# Patient Record
Sex: Male | Born: 1965 | Race: Black or African American | Hispanic: No | Marital: Married | Smoking: Former smoker
Health system: Southern US, Community
[De-identification: ages and names within clinical notes are randomized; demographics above are authoritative.]

## PROBLEM LIST (undated history)

## (undated) DIAGNOSIS — I2510 Atherosclerotic heart disease of native coronary artery without angina pectoris: Secondary | ICD-10-CM

## (undated) DIAGNOSIS — G4733 Obstructive sleep apnea (adult) (pediatric): Secondary | ICD-10-CM

## (undated) DIAGNOSIS — Z9049 Acquired absence of other specified parts of digestive tract: Secondary | ICD-10-CM

## (undated) DIAGNOSIS — N529 Male erectile dysfunction, unspecified: Secondary | ICD-10-CM

## (undated) DIAGNOSIS — I219 Acute myocardial infarction, unspecified: Secondary | ICD-10-CM

## (undated) DIAGNOSIS — E785 Hyperlipidemia, unspecified: Secondary | ICD-10-CM

## (undated) DIAGNOSIS — K219 Gastro-esophageal reflux disease without esophagitis: Secondary | ICD-10-CM

## (undated) DIAGNOSIS — I10 Essential (primary) hypertension: Secondary | ICD-10-CM

## (undated) DIAGNOSIS — Z9289 Personal history of other medical treatment: Secondary | ICD-10-CM

## (undated) DIAGNOSIS — Z87891 Personal history of nicotine dependence: Secondary | ICD-10-CM

## (undated) HISTORY — DX: Essential (primary) hypertension: I10

## (undated) HISTORY — DX: Gastro-esophageal reflux disease without esophagitis: K21.9

## (undated) HISTORY — DX: Personal history of nicotine dependence: Z87.891

## (undated) HISTORY — PX: CORONARY STENT PLACEMENT: SHX1402

## (undated) HISTORY — DX: Obstructive sleep apnea (adult) (pediatric): G47.33

## (undated) HISTORY — DX: Acquired absence of other specified parts of digestive tract: Z90.49

## (undated) HISTORY — DX: Personal history of other medical treatment: Z92.89

## (undated) HISTORY — DX: Hyperlipidemia, unspecified: E78.5

## (undated) HISTORY — DX: Male erectile dysfunction, unspecified: N52.9

## (undated) HISTORY — PX: APPENDECTOMY: SHX54

## (undated) HISTORY — DX: Atherosclerotic heart disease of native coronary artery without angina pectoris: I25.10

---

## 2008-04-09 ENCOUNTER — Emergency Department (HOSPITAL_COMMUNITY): Admission: EM | Admit: 2008-04-09 | Discharge: 2008-04-09 | Payer: Self-pay | Admitting: Emergency Medicine

## 2009-06-29 DIAGNOSIS — I219 Acute myocardial infarction, unspecified: Secondary | ICD-10-CM

## 2009-06-29 HISTORY — DX: Acute myocardial infarction, unspecified: I21.9

## 2010-04-19 ENCOUNTER — Inpatient Hospital Stay (HOSPITAL_COMMUNITY): Admission: EM | Admit: 2010-04-19 | Discharge: 2010-04-21 | Payer: Self-pay | Admitting: Emergency Medicine

## 2010-04-19 ENCOUNTER — Ambulatory Visit: Payer: Self-pay | Admitting: Cardiovascular Disease

## 2010-04-28 ENCOUNTER — Encounter: Payer: Self-pay | Admitting: Cardiology

## 2010-04-29 ENCOUNTER — Telehealth (INDEPENDENT_AMBULATORY_CARE_PROVIDER_SITE_OTHER): Payer: Self-pay | Admitting: *Deleted

## 2010-05-01 ENCOUNTER — Telehealth (INDEPENDENT_AMBULATORY_CARE_PROVIDER_SITE_OTHER): Payer: Self-pay | Admitting: *Deleted

## 2010-05-08 ENCOUNTER — Encounter: Payer: Self-pay | Admitting: Cardiology

## 2010-05-08 ENCOUNTER — Encounter (HOSPITAL_COMMUNITY)
Admission: RE | Admit: 2010-05-08 | Discharge: 2010-06-28 | Payer: Self-pay | Source: Home / Self Care | Attending: Cardiology | Admitting: Cardiology

## 2010-05-09 ENCOUNTER — Ambulatory Visit: Payer: Self-pay | Admitting: Cardiology

## 2010-05-09 ENCOUNTER — Ambulatory Visit: Payer: Self-pay

## 2010-05-09 ENCOUNTER — Encounter: Payer: Self-pay | Admitting: Cardiology

## 2010-05-09 ENCOUNTER — Ambulatory Visit (HOSPITAL_COMMUNITY): Admission: RE | Admit: 2010-05-09 | Discharge: 2010-05-09 | Payer: Self-pay | Admitting: Cardiology

## 2010-05-09 DIAGNOSIS — I2541 Coronary artery aneurysm: Secondary | ICD-10-CM | POA: Insufficient documentation

## 2010-05-09 DIAGNOSIS — I2510 Atherosclerotic heart disease of native coronary artery without angina pectoris: Secondary | ICD-10-CM | POA: Insufficient documentation

## 2010-05-09 DIAGNOSIS — I10 Essential (primary) hypertension: Secondary | ICD-10-CM | POA: Insufficient documentation

## 2010-05-09 DIAGNOSIS — E785 Hyperlipidemia, unspecified: Secondary | ICD-10-CM | POA: Insufficient documentation

## 2010-05-12 ENCOUNTER — Telehealth (INDEPENDENT_AMBULATORY_CARE_PROVIDER_SITE_OTHER): Payer: Self-pay | Admitting: *Deleted

## 2010-05-14 ENCOUNTER — Encounter: Payer: Self-pay | Admitting: Cardiology

## 2010-05-15 ENCOUNTER — Telehealth: Payer: Self-pay | Admitting: Cardiology

## 2010-05-19 ENCOUNTER — Telehealth: Payer: Self-pay | Admitting: Cardiology

## 2010-05-20 ENCOUNTER — Encounter: Payer: Self-pay | Admitting: Cardiology

## 2010-05-29 ENCOUNTER — Telehealth: Payer: Self-pay | Admitting: Cardiology

## 2010-06-02 ENCOUNTER — Encounter: Payer: Self-pay | Admitting: Cardiology

## 2010-06-03 ENCOUNTER — Encounter: Payer: Self-pay | Admitting: Cardiology

## 2010-06-04 ENCOUNTER — Ambulatory Visit: Payer: Self-pay | Admitting: Cardiology

## 2010-06-06 ENCOUNTER — Encounter: Payer: Self-pay | Admitting: Cardiology

## 2010-06-06 ENCOUNTER — Telehealth: Payer: Self-pay | Admitting: Internal Medicine

## 2010-06-06 LAB — CONVERTED CEMR LAB
BUN: 10 mg/dL (ref 6–23)
CO2: 29 meq/L (ref 19–32)
Chloride: 103 meq/L (ref 96–112)
Creatinine, Ser: 1.1 mg/dL (ref 0.4–1.5)
Glucose, Bld: 102 mg/dL — ABNORMAL HIGH (ref 70–99)

## 2010-06-09 ENCOUNTER — Encounter: Payer: Self-pay | Admitting: Cardiology

## 2010-06-10 ENCOUNTER — Telehealth: Payer: Self-pay | Admitting: Cardiology

## 2010-06-11 ENCOUNTER — Encounter: Payer: Self-pay | Admitting: Cardiology

## 2010-06-13 ENCOUNTER — Telehealth (INDEPENDENT_AMBULATORY_CARE_PROVIDER_SITE_OTHER): Payer: Self-pay | Admitting: *Deleted

## 2010-06-13 ENCOUNTER — Telehealth: Payer: Self-pay | Admitting: Cardiology

## 2010-06-19 ENCOUNTER — Encounter: Payer: Self-pay | Admitting: Cardiology

## 2010-06-19 ENCOUNTER — Telehealth: Payer: Self-pay | Admitting: Cardiology

## 2010-06-27 ENCOUNTER — Ambulatory Visit: Payer: Self-pay | Admitting: Cardiology

## 2010-07-01 ENCOUNTER — Telehealth (INDEPENDENT_AMBULATORY_CARE_PROVIDER_SITE_OTHER): Payer: Self-pay | Admitting: *Deleted

## 2010-07-03 ENCOUNTER — Encounter: Payer: Self-pay | Admitting: Cardiology

## 2010-07-04 ENCOUNTER — Encounter: Payer: Self-pay | Admitting: Cardiology

## 2010-07-04 LAB — CONVERTED CEMR LAB
Albumin: 4.2 g/dL (ref 3.5–5.2)
BUN: 12 mg/dL (ref 6–23)
Calcium: 9.4 mg/dL (ref 8.4–10.5)
Cholesterol: 160 mg/dL (ref 0–200)
Creatinine, Ser: 1.1 mg/dL (ref 0.4–1.5)
GFR calc non Af Amer: 98.19 mL/min (ref >60.00)
Glucose, Bld: 79 mg/dL (ref 70–99)
HDL: 66.6 mg/dL (ref >39.00)
VLDL: 18.4 mg/dL (ref 0.0–40.0)

## 2010-07-29 NOTE — Miscellaneous (Signed)
  Clinical Lists Changes  Observations: Added new observation of ECHOINTERP:   - Left ventricle: Upper septal thickening, but no LVOT gradient. The       cavity size was normal. Wall thickness was increased increased in       a pattern of mild to moderate LVH. The estimated ejection fraction       was 65%. Doppler parameters are consistent with high ventricular       filling pressure.     - Left atrium: The atrium was mildly dilated.     - Right ventricle: The cavity size was mildly dilated. Systolic       function was mildly reduced. (05/09/2010 14:32)      Echocardiogram  Procedure date:  05/09/2010  Findings:        - Left ventricle: Upper septal thickening, but no LVOT gradient. The       cavity size was normal. Wall thickness was increased increased in       a pattern of mild to moderate LVH. The estimated ejection fraction       was 65%. Doppler parameters are consistent with high ventricular       filling pressure.     - Left atrium: The atrium was mildly dilated.     - Right ventricle: The cavity size was mildly dilated. Systolic       function was mildly reduced.

## 2010-07-29 NOTE — Assessment & Plan Note (Signed)
Summary: eph   Visit Type:  new pt, eph Primary Provider:  Dr. Anna Genre [facility])  CC:  chest pain.  History of Present Illness: 45 yo with history of HTN and hyperlipidemia presents to establish outpatient cardiology followup after NSTEMI with DES to PDA.  He was admitted in 10/11 with chest pain/NSTEMI, and LHC showed 99% PDA stenosis which was treated with Promus DES.  EF is preserved on echo done today.  Patient has been doing well in general since discharge.  He has altered his diet and is avoiding fried food.  He had on episode of chest tightness last Tuesday that seemed to be related to stress.  He took NTG without immediate resolution but the chest pain resolved soon on its own.  He has been walking for exercise without exertional dyspnea or chest pain.  He is going to start cardiac rehab on Monday and remain in the program as long as possible, though he plans to go back to work on 11/28 and is unsure if he will be able to complete rehab.    ECG: NSR at 56, normal  Labs (10/11): K 3.9, creatinine 1.24  Current Medications (verified): 1)  Aminofen 325 Mg Tabs (Acetaminophen) .[name].. 1-2 Tablets Every 4-6 Hours As Needed 2)  Aspirin Ec 325 Mg Tbec (Aspirin) .... Take One Tablet By Mouth Daily 3)  Lipitor 80 Mg Tabs (Atorvastatin Calcium) .... Take One Tablet By Mouth Daily. 4)  Lisinopril 10 Mg Tabs (Lisinopril) .... Take One Tablet By Mouth Daily 5)  Metoprolol Tartrate 25 Mg Tabs (Metoprolol Tartrate) .... Take One Tablet By Mouth Twice A Day 6)  Nitrostat 0.4 Mg Subl (Nitroglycerin) .[name].. 1 Tablet Under Tongue At Onset of Chest Pain; You May Repeat Every 5 Minutes For Up To 3 Doses. 7)  Amlodipine Besylate 10 Mg Tabs (Amlodipine Besylate) .... Take 1/2 Tablet By Mouth Daily 8)  Effient 10 Mg Tabs (Prasugrel Hcl) .... Once Daily 9)  Prilosec 20 Mg Cpdr (Omeprazole) .... Once Daily 10)  Visine 0.05 % Soln (Tetrahydrozoline Hcl) .... 2 Droppes in Each Eye, Once Daily  Allergies  (verified): No Known Drug Allergies  Past History:  Past Medical History: 1. Hypertension 2. Hyperlipidemia 3. GERD 4. CAD: NSTEMI 10/11.  LHC showed 99% PDA, 60% OM2, EF 65%.  He had 2.25 x 15 Promus DES to PDA.  Echo (11/11) showed EF 60%, mild to moderate LVH, moderate diastolic dysfunction.  5. Prior smoker 6. h/o appendectomy  Family History: No premature CAD.  Grandfathers both had CAD, diagnosed in their 36s and 2s.  DM.  Healthy siblings.   Social History: The patient smoked half-pack per day for 20 years, quit in 7/11.  He denies use of alcohol or drugs.   He lives with his girlfriend in [place].  Has children.  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      Review of Systems       All systems reviewed and negative except as per HPI.   Vital Signs:  Patient profile:   45 year old male Height:      72 inches Weight:      290.25 pounds BMI:     39.51 Pulse rate:   62 / minute BP sitting:   138 / 90  (left arm) Cuff size:   large  Vitals Entered By: Caralee Ates CMA (May 09, 2010 9:39 AM)  Physical Exam  General:  Well developed, well nourished, in no acute distress.  Obese.  Head:  normocephalic and atraumatic Nose:  no deformity, discharge, inflammation, or lesions Mouth:  Teeth, gums and palate normal. Oral mucosa normal. Neck:  Neck supple, no JVD. No masses, thyromegaly or abnormal cervical nodes. Lungs:  Clear bilaterally to auscultation and percussion. Heart:  Non-displaced PMI, chest non-tender; regular rate and rhythm, S1, S2 without murmurs, rubs or gallops. Carotid upstroke normal, no bruit. Pedals normal pulses. No edema, no varicosities. Abdomen:  Bowel sounds positive; abdomen soft and non-tender without masses, organomegaly, or hernias noted. No hepatosplenomegaly. Extremities:  No clubbing or cyanosis. Neurologic:  Alert and oriented x 3. Skin:  Intact without lesions or rashes. Psych:  Normal affect.   Impression &  Recommendations:  Problem # 1:  CAD, NATIVE VESSEL (ICD-414.01) NSTEMI 10/11.  Patient has done well since going home.  EF is preserved on echo today.  He is planning to start cardiac rehab Monday.  He will go back to work on 11/28 as long as he is doing well in rehab (does not know how long he will actually be able to continue rehab).  He will continue ASA and Effient as well as beta blocker, ACEI, and high dose statin.   Problem # 2:  HYPERTENSION, UNSPECIFIED (ICD-401.9) Borderline BP today.  I am going to change his beta blocker to carvedilol for better BP lowering effect.   Problem # 3:  HYPERLIPIDEMIA-MIXED (ICD-272.4) Needs lipids/LFTs in about a month on Lipitor.  Goal LDL < 70.   Patient Instructions: 1)  Your physician has recommended you make the following change in your medication:  2)  Stop Metoprolol. 3)  Start Coreg(carvedilol) 6.25mg  twice a day. 4)  Your physician recommends that you return for a FASTING lipid profile/liver profile/BMP in 2 months. 414.01 401.9 5)  Your physician recommends that you schedule a follow-up appointment in: 2 months with Dr Shirlee Latch. It is OK to have your lab done the same day of your appointment with Dr Shirlee Latch in 2 months. 6)  It is OK to participate in Cardiac Rehab on Monday November 11,2011. Prescriptions: COREG 6.25 MG TABS (CARVEDILOL) one twice a day  #60 x 6   Entered by:   Katina Dung, RN, BSN   Authorized by:   Marca Ancona, MD   Signed by:   Katina Dung, RN, BSN on 05/09/2010   Method used:   Electronically to        [facility] (retail)       [address]       [place], [place]  [address]       Ph: [phone]       Fax: [phone]   RxID:   [id number]

## 2010-07-29 NOTE — Progress Notes (Signed)
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  Katina Dung, RN, BSN  May 19, 2010 1:49 PM      Appended Document: calling returning to work Increase amlodipine to 10 mg daily.  Call for BP check in 2 wks.  If patient feels well, he can return to work on the 11/28.  If he feels like he needs more time, can arrange for that.   Appended Document: calling returning to work I talked with Carlette at Cardiac Rehab--pt 's B/P is trending down (coreg increased to 12.5mg  two times a day -05/15/10) -yesterday pre-exercise B/P was 126/60--per Dr Milda Smart not increase Amlodipine at present --Carlette will continue to take and record pt's B/P and will fax a B/P log to Dr Shirlee Latch in 2 weeks for him to review-pt is aware of above--pt feels he is not ready to return to work 05/26/10--pt will not  be able to attend Cardiac Rehab  when he returns to work--he feels like he  would benefit from at least  2 more weeks of Cardiac Rehab  to improve his exercise tolerance and physical conditioning--he feels he would also  benefit from extra time for B/P monitoring and control --pt requesting note to remain out of work until December 12,2011

## 2010-07-29 NOTE — Letter (Signed)
Summary: [facility]  [facility]   Imported By: Marylou Mccoy 05/21/2010 14:20:48  _____________________________________________________________________  External Attachment:    Type:   Image     Comment:   External Document

## 2010-07-29 NOTE — Progress Notes (Signed)
----   Converted from flag ---- ---- 05/01/2010 11:37 AM, Bary Leriche wrote: Blanca Friend, I am sending you this message, because Thurston Hole is out until Monday. I am sending Dr. Shirlee Latch 2 forms to review. One is a disability and on is an Transport planner. They are for Festus Pursel  dob 13-Feb-1966.    Thank You, Elease Hashimoto at [employer] ------------------------------

## 2010-07-29 NOTE — Letter (Signed)
Summary: Return To Work  [facility], Main Office  [address]   [place], [place] [address]   Phone: [phone]  Fax: [phone]    05/20/2010  TO: Leodis Sias IT MAY CONCERN   RE: [name] [address] [place]   The above named individual is under my medical care and should remain out of work until 06/09/10.   If you have any further questions or need additional information, please call.     Sincerely,      Denzil Mceachron,MD

## 2010-07-29 NOTE — Progress Notes (Signed)
Summary: B/P readings from Cardiac Rehab  Phone Note Outgoing Call   Call placed by: Katina Dung, RN, BSN,  May 29, 2010 2:40 PM Call placed to: Patient Summary of Call: B/P  Follow-up for Phone Call        received B/P readings from Cardiac Rehab--Dr Shirlee Latch reviewed--Dr Shirlee Latch recommended pt increase Lisinopril to 20mg  daily--I talked with pt and he verbalized understanding--pt states he is scheduled to return to work and is requesting an appt with Dr Shirlee Latch prior to returning to work--I have scheduled an appt with Dr Shirlee Latch and a BMP for 06/04/10-B/P readings from Cardiac Rehab to HIT to be scanned into EMR     New/Updated Medications: LISINOPRIL 20 MG TABS (LISINOPRIL) one daily Prescriptions: LISINOPRIL 20 MG TABS (LISINOPRIL) one daily  #30 x 6   Entered by:   Katina Dung, RN, BSN   Authorized by:   Marca Ancona, MD   Signed by:   Katina Dung, RN, BSN on 05/29/2010   Method used:   Electronically to        [facility] (retail)       [address]       [place], [place]  [address]       Ph: [phone]       Fax: [phone]   RxID:   [id number]

## 2010-07-29 NOTE — Progress Notes (Signed)
Summary: elevated B/P at Cardiac Rehab  Phone Note Outgoing Call   Call placed by: Katina Dung, RN, BSN,  May 15, 2010 10:42 AM Call placed to: Patient Summary of Call: B/P evelvated at Cardiac Rehab  Follow-up for Phone Call        Received fax from cardiac rehab with elevated B/P readings--Dr Shirlee Latch recommended increasing Coreg  to 12.5mg  twice a day--I talked with pt --he verbalized understanding--original report from Cardiac Rehab  to EMR     New/Updated Medications: COREG 12.5 MG TABS (CARVEDILOL) one twice a day Prescriptions: COREG 12.5 MG TABS (CARVEDILOL) one twice a day  #60 x 6   Entered by:   Katina Dung, RN, BSN   Authorized by:   Marca Ancona, MD   Signed by:   Katina Dung, RN, BSN on 05/15/2010   Method used:   Electronically to        [facility] (retail)       [address]       [place], [place]  [address]       Ph: [phone]       Fax: [phone]   RxID:   [id number]    Current Medications (verified): 1)  Aminofen 325 Mg Tabs (Acetaminophen) .[name].. 1-2 Tablets Every 4-6 Hours As Needed 2)  Aspirin Ec 325 Mg Tbec (Aspirin) .... Take One Tablet By Mouth Daily 3)  Lipitor 80 Mg Tabs (Atorvastatin Calcium) .... Take One Tablet By Mouth Daily. 4)  Lisinopril 10 Mg Tabs (Lisinopril) .... Take One Tablet By Mouth Daily 5)  Coreg 12.5 Mg Tabs (Carvedilol) .... One Twice A Day 6)  Nitrostat 0.4 Mg Subl (Nitroglycerin) .[name].. 1 Tablet Under Tongue At Onset of Chest Pain; You May Repeat Every 5 Minutes For Up To 3 Doses. 7)  Amlodipine Besylate 10 Mg Tabs (Amlodipine Besylate) .... Take 1/2 Tablet By Mouth Daily 8)  Effient 10 Mg Tabs (Prasugrel Hcl) .... Once Daily 9)  Prilosec 20 Mg Cpdr (Omeprazole) .... Once Daily 10)  Visine 0.05 % Soln (Tetrahydrozoline Hcl) .... 2 Droppes in Each Eye, Once Daily  Allergies: No Known Drug Allergies

## 2010-07-29 NOTE — Progress Notes (Signed)
  Pt dropped Of [employer] papers to be completed, also completed Disability packet have sent all to [employer] for completion. [facility]  April 29, 2010 11:44 AM

## 2010-07-29 NOTE — Progress Notes (Signed)
Summary: [name] cardiac rehab  Phone Note From Other Clinic   Caller: [name] [phone] Summary of Call: Allen calling back to explain situation re pt Initial call taken by: Glynda Jaeger,  June 06, 2010 1:21 PM  Follow-up for Phone Call        Spoke with Carlette from cardiac rehab and she advised me that his BP at rest was normal but with exercise it increased to 190/100 and then back down to normal. They will foward  BP readings to Dr.McLean and continue to monitor next week.  Will give patient a new note with potential RTW date of 06/16/2010 pending BP issue. He does heavy lifting at work. Will let Dr. Shirlee Latch know.  Layne Benton, RN, BSN  June 06, 2010 2:02 PM

## 2010-07-29 NOTE — Miscellaneous (Signed)
Summary: [facility] Physician Order/Treatment Plan   [facility] Physician Order/Treatment Plan   Imported By: Roderic Ovens 05/05/2010 09:09:31  _____________________________________________________________________  External Attachment:    Type:   Image     Comment:   External Document

## 2010-07-29 NOTE — Progress Notes (Signed)
Summary: Records Request  Faxed OV & EKG to Carlette at [facility]. ([phone]). Debby Freiberg  May 12, 2010 9:26 AM

## 2010-07-31 NOTE — Miscellaneous (Signed)
Summary: [name] Cardiac Progress Report   [name] Card[facility] Progress Report   Imported By: Roderic Ovens 07/10/2010 16:38:34  _____________________________________________________________________  External Attachment:    Type:   Image     Comment:   External Document

## 2010-07-31 NOTE — Letter (Signed)
Summary: [facility]  [facility]   Imported By: Marylou Mccoy 06/10/2010 15:21:26  _____________________________________________________________________  External Attachment:    Type:   Image     Comment:   External Document

## 2010-07-31 NOTE — Letter (Signed)
Summary: [facility]  [facility]   Imported By: Marylou Mccoy 06/27/2010 18:15:09  _____________________________________________________________________  External Attachment:    Type:   Image     Comment:   External Document

## 2010-07-31 NOTE — Miscellaneous (Signed)
Summary: [facility]  [facility]   Imported By: Erle Crocker 06/13/2010 11:57:43  _____________________________________________________________________  External Attachment:    Type:   Image     Comment:   External Document

## 2010-07-31 NOTE — Letter (Signed)
Summary: Custom - Lipid  [facility], Main Office  [address]   [place], [place] [address]   Phone: [phone]  Fax: [phone]     July 04, 2010 MRN: [id number]   [facility] [address] [place], [place]  [address]   Dear Mr. Weinmann,  Dr. Shirlee Latch has reviewed your cholesterol results.  They are as follows:     Total Cholesterol:    160 (Desirable: less than 200)       HDL  Cholesterol:     66.60  (Desirable: greater than 40 for men and 50 for women)       LDL Cholesterol:       75  (Desirable: less than 100 for low risk and less than 70 for moderate to high risk)       Triglycerides:       92.0  (Desirable: less than 150)  His  recommendations include: this is good! No new recommendations.   Call our office at the number listed above if you have any questions.  Lowering your LDL cholesterol is important, but it is only one of a large number of "risk factors" that may indicate that you are at risk for heart disease, stroke or other complications of hardening of the arteries.  Other risk factors include:   A.  Cigarette Smoking* B.  High Blood Pressure* C.  Obesity* D.   Low HDL Cholesterol (see yours above)* E.   Diabetes Mellitus (higher risk if your is uncontrolled) F.  Family history of premature heart disease G.  Previous history of stroke or cardiovascular disease    *These are risk factors YOU HAVE CONTROL OVER.  For more information, visit .  There is now evidence that lowering the TOTAL CHOLESTEROL AND LDL CHOLESTEROL can reduce the risk of heart disease.  The American Heart Association recommends the following guidelines for the treatment of elevated cholesterol:  1.  If there is now current heart disease and less than two risk factors, TOTAL CHOLESTEROL should be less than 200 and LDL CHOLESTEROL should be less than 100. 2.  If there is current heart disease or two or more risk factors, TOTAL CHOLESTEROL should be less than 200  and LDL CHOLESTEROL should be less than 70.  A diet low in cholesterol, saturated fat, and calories is the cornerstone of treatment for elevated cholesterol.  Cessation of smoking and exercise are also important in the management of elevated cholesterol and preventing vascular disease.  Studies have shown that 30 to 60 minutes of physical activity most days can help lower blood pressure, lower cholesterol, and keep your weight at a healthy level.  Drug therapy is used when cholesterol levels do not respond to therapeutic lifestyle changes (smoking cessation, diet, and exercise) and remains unacceptably high.  If medication is started, it is important to have you levels checked periodically to evaluate the need for further treatment options.  Thank you,    [facility] Team

## 2010-07-31 NOTE — Miscellaneous (Signed)
Summary: [facility] Cardiac Progress Report   [facility] Progress Report   Imported By: Roderic Ovens 06/24/2010 16:33:09  _____________________________________________________________________  External Attachment:    Type:   Image     Comment:   External Document

## 2010-07-31 NOTE — Progress Notes (Signed)
Summary: need a note to return to work  Phone Note Call from Patient Call back at [employer] [phone]   Caller: Spouse/Carol Complaint: Cough/Sore throat Summary of Call: Pt need a return note for the first year need to state for the up coming year  Initial call taken by: Judie Grieve,  June 10, 2010 2:22 PM  Follow-up for Phone Call        wants a note stating that he needs to be out of work until the 1st of the year.  He needs to continue in cardiac rehab but d/t his BP being elevated they have not been able to increase his work-load to get him where he needs to be.  The  company is going to close down for 2 weeks for the holiday and he needs the letter ASAP.  Pt aware approval for this letter will need tocome from Dr Shirlee Latch and he is not in the office today.  Pt states understanding Follow-up by: Charolotte Capuchin, RN,  June 10, 2010 3:32 PM     Appended Document: need a note to return to work Please get this note for him, thanks.

## 2010-07-31 NOTE — Progress Notes (Signed)
Summary: question on meds  Phone Note Call from Patient Call back at Home Phone [phone] Call back at [phone]   Caller: Daughter/carol Reason for Call: Talk to Nurse Summary of Call: pt has a reaction to his meds. pt daughter stated he been geting dizzy when he takes his meds. Initial call taken by: Roe Coombs,  June 19, 2010 8:05 AM  Follow-up for Phone Call        RN attempted to call back at both numbers; left message on [phone] Bernita Raisin, RN, BSN  June 19, 2010 8:27 AM  Jolyn Lent - Pt's (girlfriend): stated that Carvedilol was increased (took new dose on Monday 06/16/10) and on Tuesday 06/17/10 began experiencing a lot of dizziness; reminded him of his vertigo symptoms several years ago. Pt went to PCP and received a cortisone shot in his shoulder. Pt was also given Rx for Flexeril on Tuesday. BP 130/84, HR 72. Pt went to Cardiac Rehab on Wednesday 06/18/10; informed Shanda Bumps of experiencing dizziness/lightheadedness.  RN reviewed with DODJuanda Chance. Cardiac Rehab Report reviewed; Advised to Decrease Carvedilol to previous dose 12.5 Mg 1 tablet two times a day. Increase Lisinopril to 40 Mg 1 tablet once a day. Repeat BMP in 7-10 days. BMP Appt scheduled for June 27, 2010 per Pt request; will also have fasting labs as well. RN s/w Pt's girlfriend - Okey Regal verbalizes understanding.    Follow-up by: Bernita Raisin, RN, BSN,  June 19, 2010 10:00 AM    New/Updated Medications: LISINOPRIL 40 MG TABS (LISINOPRIL) take 1 tablet by mouth daily COREG 12.5 MG TABS (CARVEDILOL) one tablet  twice a day  Current Allergies (reviewed today): No known allergies

## 2010-07-31 NOTE — Letter (Signed)
Summary: [facility]  [facility]   Imported By: Marylou Mccoy 06/16/2010 18:28:18  _____________________________________________________________________  External Attachment:    Type:   Image     Comment:   External Document

## 2010-07-31 NOTE — Progress Notes (Signed)
  Phone Note Call from Patient   Caller: Patient Summary of Call: Clearance/Restrictions Initial call taken by: KM  Follow-up for Phone Call        pt' job calling to say they need exact date pt can return to work, as well as any restrictions he may have Glynda Jaeger  July 02, 2010 10:57 AM   Additional Follow-up for Phone Call Additional follow up Details #1::        Faxed [employer] paper to [employer] @ [phone]..it has restrictions/Clearance to be able to return to work Additional Follow-up by: .sign    Pt called from Job, his Job needs Clearance/restrictions Faxed to AttnRanda Evens @ [phone]. For him to be able to Continue to Work. [facility]  July 01, 2010 9:22 AM

## 2010-07-31 NOTE — Progress Notes (Signed)
  Request received from [facility] sent to [facility]  June 13, 2010 1:12 PM

## 2010-07-31 NOTE — Letter (Signed)
Summary: Generic Letter  [facility], Main Office  [address]   [place], [place] [address]   Phone: [phone]  Fax: [phone]        July 03, 2010 MRN: [id number]    [facility] [address] [place], [place]  [address]  To Whom It May Concern:  Mr Wichmann may return to work without restrictions.         Sincerely,     Artina Minella,MD  This letter has been electronically signed by your physician.  Appended Document: Generic Letter Back to Work Note Faxed to [employer] @ [phone]

## 2010-07-31 NOTE — Progress Notes (Signed)
Summary: increase Coreg  Phone Note Outgoing Call   Call placed by: Katina Dung, RN, BSN,  June 13, 2010 2:57 PM Call placed to: Patient Summary of Call: B/P readings at rehab  Follow-up for Phone Call        Dr Shirlee Latch recommended pt increase Coreg to  18.75mg  twice a day--I notified pt     New/Updated Medications: COREG 12.5 MG TABS (CARVEDILOL) one and one-half  twice a day Prescriptions: COREG 12.5 MG TABS (CARVEDILOL) one and one-half  twice a day  #90 x 6   Entered by:   Katina Dung, RN, BSN   Authorized by:   Marca Ancona, MD   Signed by:   Katina Dung, RN, BSN on 06/13/2010   Method used:   Electronically to        [facility] (retail)       [address]       [place], [place]  [address]       Ph: [phone]       Fax: [phone]   RxID:   [id number]    Current Medications (verified): 1)  Aspirin Ec 325 Mg Tbec (Aspirin) .... Take One Tablet By Mouth Daily 2)  Lipitor 80 Mg Tabs (Atorvastatin Calcium) .... Take One Tablet By Mouth Daily. 3)  Lisinopril 20 Mg Tabs (Lisinopril) .... One Daily 4)  Coreg 12.5 Mg Tabs (Carvedilol) .... One and One-Half  Twice A Day 5)  Nitrostat 0.4 Mg Subl (Nitroglycerin) .[name].. 1 Tablet Under Tongue At Onset of Chest Pain; You May Repeat Every 5 Minutes For Up To 3 Doses. 6)  Amlodipine Besylate 10 Mg Tabs (Amlodipine Besylate) .... Take One Tablet Daily 7)  Effient 10 Mg Tabs (Prasugrel Hcl) .... Once Daily 8)  Prilosec 20 Mg Cpdr (Omeprazole) .... Once Daily  Allergies: No Known Drug Allergies

## 2010-07-31 NOTE — Assessment & Plan Note (Signed)
Summary: [name]   Primary Provider:  Dr. Anna Genre [facility])  CC:  high blood pressure.  History of Present Illness: 45 yo with history of HTN and hyperlipidemia presents for followup after NSTEMI with DES to PDA.  He was admitted in 10/11 with chest pain/NSTEMI, and LHC showed 99% PDA stenosis which was treated with Promus DES.  EF was preserved on echo in 11/11.  Patient has been in cardiac rehab but has been limited somewhat by elevated BP, especially with exertion.  He has not had any chest pain.  No exertional dyspnea.  BP today is 142/98.  BP at cardiac rehab is showing an improving trend with med titration but is still too high.   Labs (10/11): K 3.9, creatinine 1.24  Current Medications (verified): 1)  Aspirin Ec 325 Mg Tbec (Aspirin) .... Take One Tablet By Mouth Daily 2)  Lipitor 80 Mg Tabs (Atorvastatin Calcium) .... Take One Tablet By Mouth Daily. 3)  Lisinopril 20 Mg Tabs (Lisinopril) .... One Daily 4)  Coreg 12.5 Mg Tabs (Carvedilol) .... One Twice A Day 5)  Nitrostat 0.4 Mg Subl (Nitroglycerin) .[name].. 1 Tablet Under Tongue At Onset of Chest Pain; You May Repeat Every 5 Minutes For Up To 3 Doses. 6)  Amlodipine Besylate 10 Mg Tabs (Amlodipine Besylate) .... Take 1/2 Tablet By Mouth Daily 7)  Effient 10 Mg Tabs (Prasugrel Hcl) .... Once Daily 8)  Prilosec 20 Mg Cpdr (Omeprazole) .... Once Daily  Allergies: No Known Drug Allergies  Past History:  Past Medical History: Reviewed history from 05/09/2010 and no changes required. 1. Hypertension 2. Hyperlipidemia 3. GERD 4. CAD: NSTEMI 10/11.  LHC showed 99% PDA, 60% OM2, EF 65%.  He had 2.25 x 15 Promus DES to PDA.  Echo (11/11) showed EF 60%, mild to moderate LVH, moderate diastolic dysfunction.  5. Prior smoker 6. h/o appendectomy  Family History: Reviewed history from 05/09/2010 and no changes required. No premature CAD.  Grandfathers both had CAD, diagnosed in their 14s and 71s.  DM.  Healthy siblings.   Social  History: Reviewed history from 05/09/2010 and no changes required. The patient smoked half-pack per day for 20 years, quit in 7/11.  He denies use of alcohol or drugs.   He lives with his girlfriend in [place].  Has children.  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      Review of Systems       All systems reviewed and negative except as per HPI.   Vital Signs:  Patient profile:   45 year old male Height:      72 inches Weight:      290 pounds BMI:     39.47 Pulse rate:   62 / minute Resp:     14 per minute BP sitting:   142 / 98  (left arm)  Vitals Entered By: Kem Parkinson (June 04, 2010 9:06 AM)  Physical Exam  General:  Well developed, well nourished, in no acute distress.  Obese.  Neck:  Neck supple, no JVD. No masses, thyromegaly or abnormal cervical nodes. Lungs:  Clear bilaterally to auscultation and percussion. Heart:  Non-displaced PMI, chest non-tender; regular rate and rhythm, S1, S2 without murmurs, rubs or gallops. Carotid upstroke normal, no bruit. Pedals normal pulses. No edema, no varicosities. Abdomen:  Bowel sounds positive; abdomen soft and non-tender without masses, organomegaly, or hernias noted. No hepatosplenomegaly. Extremities:  No clubbing or cyanosis. Neurologic:  Alert and oriented x 3. Psych:  Normal affect.  Impression & Recommendations:  Problem # 1:  CAD, NATIVE VESSEL (ICD-414.01) NSTEMI 10/11.  Patient has done well since going home.  EF was preserved on echo.  No chest pain.  He will continue ASA and Effient as well as beta blocker, ACEI, and high dose statin.   Problem # 2:  HYPERTENSION, UNSPECIFIED (ICD-401.9) BP is still running high and limiting advancement in cardiac rehab.  Increase amlodipine to 10 mg daily.  BP check in 1 week.   Problem # 3:  HYPERLIPIDEMIA-MIXED (ICD-272.4) Goal LDL < 70.  Needs lipids/LFTs in 1/12.    Will get BMET today after increasing lisinopril  Followup in 2 months.   Patient  Instructions: 1)  Your physician has recommended you make the following change in your medication:  2)  Increase Amlodipine to 10mg  daily. 3)  Take and record your blood pressure about 2 hours after you take your medication--I will call you in 2 weeks to get the readings. Luana Shu 4)  Lab today --BMP  414.01 401.9 5)  Your physician recommends that you return for a FASTING lipid profile/liver profile in January 2012  414.01 401.9 6)  Your physician recommends that you schedule a follow-up appointment in: 2 months with Dr Shirlee Latch. Prescriptions: AMLODIPINE BESYLATE 10 MG TABS (AMLODIPINE BESYLATE) Take one tablet daily  #30 x 6   Entered by:   Katina Dung, RN, BSN   Authorized by:   Marca Ancona, MD   Signed by:   Katina Dung, RN, BSN on 06/04/2010   Method used:   Electronically to        [facility] (retail)       [address]       [place], [place]  [address]       Ph: [phone]       Fax: [phone]   RxID:   [id number]

## 2010-07-31 NOTE — Letter (Signed)
Summary: Return To Work  [employer], Main Office  [address]   [place], [place] [address]   Phone: [phone]  Fax: [phone]    06/11/2010  TO: Leodis Sias IT MAY CONCERN   RE: [name] [address] [id number]   The above named individual is under my medical care and should remain out of work until January 2,2012.    If you have any further questions or need additional information, please call.     Sincerely,      Dalton McLean,MD

## 2010-07-31 NOTE — Letter (Signed)
Summary: Return To Work  [facility] [employer], Main Office  [address]   [place], [place] [address]   Phone: [phone]  Fax: [phone]    06/06/2010  TO: Leodis Sias IT MAY CONCERN   RE: NICKI GRACY [address] [id number]   The above named individual is under my medical care and may potentially return to work on:06/16/2010 pending medical issues.   If you have any further questions or need additional information, please call.     Sincerely,    Layne Benton, RN, BSN

## 2010-08-13 ENCOUNTER — Ambulatory Visit: Payer: BC Managed Care – PPO | Admitting: Cardiology

## 2010-08-13 ENCOUNTER — Encounter: Payer: Self-pay | Admitting: Cardiology

## 2010-08-20 NOTE — Assessment & Plan Note (Signed)
Summary: 2 MONTH ROV.SL/AMD   Allergies: No Known Drug Allergies

## 2010-09-09 NOTE — Letter (Signed)
Summary: [facility] Progress Note  [facility] Progress Note   Imported By: Earl Many 09/05/2010 13:00:49  _____________________________________________________________________  External Attachment:    Type:   Image     Comment:   External Document

## 2010-09-09 NOTE — Letter (Signed)
Summary: [facility]  [facility]   Imported By: Earl Many 09/05/2010 13:01:52  _____________________________________________________________________  External Attachment:    Type:   Image     Comment:   External Document

## 2010-09-10 LAB — DIFFERENTIAL
Basophils Absolute: 0 10*3/uL (ref 0.0–0.1)
Eosinophils Absolute: 0.2 10*3/uL (ref 0.0–0.7)
Eosinophils Relative: 3 % (ref 0–5)
Lymphs Abs: 2.1 10*3/uL (ref 0.7–4.0)
Monocytes Absolute: 0.7 10*3/uL (ref 0.1–1.0)

## 2010-09-10 LAB — BASIC METABOLIC PANEL
CO2: 26 mEq/L (ref 19–32)
CO2: 28 mEq/L (ref 19–32)
Calcium: 9 mg/dL (ref 8.4–10.5)
Creatinine, Ser: 1.11 mg/dL (ref 0.4–1.5)
GFR calc non Af Amer: >60 mL/min (ref >60)
Glucose, Bld: 102 mg/dL — ABNORMAL HIGH (ref 70–99)
Glucose, Bld: 105 mg/dL — ABNORMAL HIGH (ref 70–99)
Potassium: 3.9 mEq/L (ref 3.5–5.1)
Sodium: 140 mEq/L (ref 135–145)

## 2010-09-10 LAB — POCT I-STAT, CHEM 8
Calcium, Ion: 1.12 mmol/L (ref 1.12–1.32)
Creatinine, Ser: 1.3 mg/dL (ref 0.4–1.5)
Hemoglobin: 15.6 g/dL (ref 13.0–17.0)
Sodium: 141 mEq/L (ref 135–145)
TCO2: 31 mmol/L (ref 0–100)

## 2010-09-10 LAB — POCT CARDIAC MARKERS
Myoglobin, poc: 58 ng/mL (ref 12–200)
Troponin i, poc: <0.05 ng/mL (ref 0.00–0.09)

## 2010-09-10 LAB — MRSA PCR SCREENING: MRSA by PCR: POSITIVE — AB

## 2010-09-10 LAB — LIPID PANEL
Cholesterol: 233 mg/dL — ABNORMAL HIGH (ref 0–200)
Total CHOL/HDL Ratio: 4.3 RATIO

## 2010-09-10 LAB — CBC
HCT: 43 % (ref 39.0–52.0)
HCT: 43.8 % (ref 39.0–52.0)
Hemoglobin: 14.8 g/dL (ref 13.0–17.0)
MCH: 28.7 pg (ref 26.0–34.0)
MCH: 29.5 pg (ref 26.0–34.0)
MCHC: 33.8 g/dL (ref 30.0–36.0)
MCHC: 34 g/dL (ref 30.0–36.0)
MCV: 84.5 fL (ref 78.0–100.0)
Platelets: 254 10*3/uL (ref 150–400)
Platelets: 260 10*3/uL (ref 150–400)
RDW: 13.4 % (ref 11.5–15.5)

## 2010-09-10 LAB — CK TOTAL AND CKMB (NOT AT ARMC)
CK, MB: 2.4 ng/mL (ref 0.3–4.0)
Total CK: 273 U/L — ABNORMAL HIGH (ref 7–232)

## 2010-09-10 LAB — TROPONIN I: Troponin I: 0.65 ng/mL (ref 0.00–0.06)

## 2010-09-12 ENCOUNTER — Ambulatory Visit (INDEPENDENT_AMBULATORY_CARE_PROVIDER_SITE_OTHER): Payer: BC Managed Care – PPO | Admitting: Cardiology

## 2010-09-12 ENCOUNTER — Encounter: Payer: Self-pay | Admitting: Cardiology

## 2010-09-12 DIAGNOSIS — I2510 Atherosclerotic heart disease of native coronary artery without angina pectoris: Secondary | ICD-10-CM

## 2010-09-25 NOTE — Assessment & Plan Note (Signed)
Summary: 2 MONTH ROV.SL/AMD   Visit Type:  Follow-up Primary Provider:  Dr. Anna Genre [facility])  CC:  2 month f/u.  History of Present Illness: 45 yo with history of HTN and hyperlipidemia presents for followup after NSTEMI with DES to PDA.  He was admitted in 10/11 with chest pain/NSTEMI, and LHC showed 99% PDA stenosis which was treated with Promus DES.  EF was preserved on echo in 11/11.  No further chest pain.  He has completed cardiac rehab.  BP is now under reasonable control (120s/80s when he checks at home).  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  No significant exertional dyspnea.    Labs (10/11): K 3.9, creatinine 1.24 Labs (12/11): K 4, creatinine 1.1, LDL 75, HDL 67  Current Medications (verified): 1)  Aspirin Ec 325 Mg Tbec (Aspirin) .... Take One Tablet By Mouth Daily 2)  Lipitor 80 Mg Tabs (Atorvastatin Calcium) .... Take One Tablet By Mouth Daily. 3)  Lisinopril 10 Mg Tabs (Lisinopril) .[name].. 1 Once Daily 4)  Coreg 12.5 Mg Tabs (Carvedilol) .... One Tablet  Twice A Day 5)  Nitrostat 0.4 Mg Subl (Nitroglycerin) .[name].. 1 Tablet Under Tongue At Onset of Chest Pain; You May Repeat Every 5 Minutes For Up To 3 Doses. 6)  Amlodipine Besylate 10 Mg Tabs (Amlodipine Besylate) .... Take One Tablet Daily 7)  Effient 10 Mg Tabs (Prasugrel Hcl) .... Once Daily 8)  Prilosec 20 Mg Cpdr (Omeprazole) .... Once Daily 9)  Multivitamins  Tabs (Multiple Vitamin) .[name].. 1 Once Daily  Allergies: No Known Drug Allergies  Past History:  Past Medical History: Reviewed history from 05/09/2010 and no changes required. 1. Hypertension 2. Hyperlipidemia 3. GERD 4. CAD: NSTEMI 10/11.  LHC showed 99% PDA, 60% OM2, EF 65%.  He had 2.25 x 15 Promus DES to PDA.  Echo (11/11) showed EF 60%, mild to moderate LVH, moderate diastolic dysfunction.  5. Prior smoker 6. h/o appendectomy  Family History: Reviewed history from 05/09/2010 and no changes required. No premature CAD.  Grandfathers both had CAD,  diagnosed in their 73s and 40s.  DM.  Healthy siblings.   Social History: Reviewed history from 05/09/2010 and no changes required. The patient smoked half-pack per day for 20 years, quit in 7/11.  He denies use of alcohol or drugs.   He lives with his girlfriend in [place].  Has children.  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      Vital Signs:  Patient profile:   45 year old male Height:      72 inches Weight:      179 pounds BMI:     24.36 Pulse rate:   80 / minute BP sitting:   120 / 98  (left arm) Cuff size:   large  Vitals Entered By: Scherrie Bateman, LPN (September 12, 2010 8:40 AM)  Physical Exam  General:  Well developed, well nourished, in no acute distress.  Obese.  Neck:  Neck supple, no JVD. No masses, thyromegaly or abnormal cervical nodes. Lungs:  Clear bilaterally to auscultation and percussion. Heart:  Non-displaced PMI, chest non-tender; regular rate and rhythm, S1, S2 without murmurs, rubs or gallops. Carotid upstroke normal, no bruit. Pedals normal pulses. No edema, no varicosities. Abdomen:  Bowel sounds positive; abdomen soft and non-tender without masses, organomegaly, or hernias noted. No hepatosplenomegaly. Extremities:  No clubbing or cyanosis. Neurologic:  Alert and oriented x 3. Psych:  Normal affect.   Impression & Recommendations:  Problem # 1:  CAD, NATIVE VESSEL (ICD-414.01) NSTEMI 10/11.  Patient has done well since going home.  EF was preserved on echo.  No chest pain.  He will continue ASA and Effient as well as beta blocker, ACEI, and high dose statin.   Problem # 2:  HYPERLIPIDEMIA-MIXED (ICD-272.4) LDL close to goal (LDL < 70) when last checked.   Problem # 3:  HYPERTENSION, UNSPECIFIED (ICD-401.9) BP is under much better control.   Patient Instructions: 1)  Your physician recommends that you continue on your current medications as directed. Please refer to the Current Medication list given to you today. 2)  Your physician  wants you to follow-up in: 4 MONTHS WITH DR [facility]  You will receive a reminder letter in the mail two months in advance. If you don't receive a letter, please call our office to schedule the follow-up appointment.

## 2010-11-11 NOTE — Op Note (Signed)
[name], [name] NO.:  [id number]   MEDICAL RECORD NO.:  [id number]          PATIENT TYPE:  EMS   LOCATION:  [facility]                         FACILITY:  [facility]   PHYSICIAN:  Allen Done, MD  DATE OF BIRTH:  10/23/1965   DATE OF PROCEDURE:  04/09/2008  DATE OF DISCHARGE:  04/09/2008                               OPERATIVE REPORT   PREOPERATIVE DIAGNOSES:  1. Left long finger open distal phalanx fracture following crush and      saw injury.  2. Left ring finger open distal phalanx fracture from crush injury.   ATTENDING SURGEON:  Allen Done, MD, who scrubbed and present  during the entire procedure.   ASSISTANT SURGEON:  None.   SURGICAL PROCEDURES:  1. Debridement of skin and subcutaneous tissue and bone, left long      finger associated with open distal phalanx fracture.  2. Left long finger revision and amputation with local neurectomies      and advancement flap closure.  3. Left ring finger debridement of skin, subcutaneous tissue, and bone      associated with open fracture.  4. Closed treatment of left distal phalanx fracture, left ring finger.  5. Left ring finger subungual hematoma evacuation and nail bed repair.   ANESTHESIA:  General via LMA.   TOURNIQUET TIME:  Less than 30 minutes at 250 mmHg.   INTRAOPERATIVE FINDINGS:  The patient did have a comminuted open left  long finger fracture and soft tissue injury that was not amendable to  replantation or reconstruction.   SURGICAL INDICATIONS:  [name] is a 45 year old right-hand-dominant  gentleman who sustained a work-related injury to his left ring and long  fingers.  The patient presented to the emergency department with the  above injuries.  The patient was counseled in the emergency department  and we elected to proceed with the above procedures.  Risks, benefits,  and alternatives were discussed in detail with the patient and a signed  informed consent was obtained.  Risks  include but not limited to  bleeding, infection, skin necrosis, loss of motion of the digits,  persistent pain in the fingers, need for further surgical intervention,  infection, and damage to nearby nerves, arteries, and tendons.   DESCRIPTION OF PROCEDURE:  The patient was properly identified in the  preop holding area and marked with a permanent marker made on the made  on left long and ring fingers to indicate correct operative sites.  The  patient was then brought back to the operating room and placed supine on  the anesthesia room table where general anesthesia was administered via  LMA.  The patient received preoperative antibiotics prior to any skin  incisions, clindamycin secondary PENICILLIN allergy.  After induction of  anesthesia, a well-padded tourniquet was then placed on the left  brachium and sealed with a 1000 drape.  The left upper extremity was  prepped with Hibiclens and then sterilely draped.  A time-out was  called, the correct site was identified, and the procedure was then  begun.  Attention was then  turned to the left long finger where  debridement of the open fracture was then carried out of the skin,  subcutaneous tissue, and bone.  The patient did have several comminuted  fragments of distal phalanx that were removed in addition to some  devitalized skin and subcutaneous tissue.  Following the debridement, it  was then felt to shorten the bone of the distal phalanx, but protect the  insertion of the flexor digitorum profundus.  Bone debridement was then  carried back to allow for adequate wound closure.  Local neurectomies  were then [name] on both the digital nerves.  Following neurectomies, skin  flaps were then advanced volarly and dorsally to allow for adequate  closure.  There was good closure over the defect and good skin coverage  over the bone.  Following this, the skin flaps were then closed with 5-0  nylon simple sutures.  Attention was then turned to  the ring finger  where the nail plate was then removed and the open fracture was then  debrided in the skin, subcutaneous tissue, and bone through the defect  in the nailbed.  Following debridement of the open fracture, the  fracture site was stable and continue with the closed treatment and  internal fixation was not used to stabilize the distal phalanx.  The  nailbed was then repaired with 5-0 chromic sutures.  Several simple 5-0  chromic sutures were used with good reapproximation of the nailbed.  The  nail plate that was removed was then cleaned up and then placed beneath  the eponychium to protect the dressing.  The patient had an open wound  volarly to the ring finger which measured less than 1 cm and the wound  was closed with simple nylon 4-0 sutures.  Adaptic dressing and sterile  compressive dressings were then applied to both wounds.  Marcaine 0.25%  was then infiltrated, 10 mL and separated into both digits for local  anesthesia.  Sterile compressive dressings were then applied.  The  patient was then placed in the finger splint and extubated and taken to  the recovery room in good condition.   POSTOPERATIVE PLAN:  The patient will be discharged home.  He will be  seen back in the office in 4 days for wound check and then be referred  down to therapy for tip protector splint.  He will then begin gradual  motion and activity as his pain levels are diminished.      Allen Done, MD  Electronically Signed     FWO/MEDQ  D:  04/09/2008  T:  04/10/2008  Job:  [id number]

## 2010-11-22 ENCOUNTER — Other Ambulatory Visit: Payer: Self-pay | Admitting: Cardiovascular Disease

## 2010-12-18 ENCOUNTER — Other Ambulatory Visit: Payer: Self-pay | Admitting: Cardiovascular Disease

## 2011-01-02 ENCOUNTER — Other Ambulatory Visit: Payer: Self-pay | Admitting: *Deleted

## 2011-01-02 MED ORDER — ATORVASTATIN CALCIUM 80 MG PO TABS: 80.0000 mg | ORAL_TABLET | Freq: Every day | ORAL | Status: DC

## 2011-01-18 ENCOUNTER — Other Ambulatory Visit: Payer: Self-pay | Admitting: Cardiology

## 2011-02-27 ENCOUNTER — Other Ambulatory Visit: Payer: Self-pay | Admitting: Cardiology

## 2011-03-31 LAB — APTT: aPTT: 27

## 2011-03-31 LAB — BASIC METABOLIC PANEL
CO2: 28
Calcium: 9
Glucose, Bld: 96
Sodium: 141

## 2011-03-31 LAB — PROTIME-INR: INR: 1

## 2011-03-31 LAB — DIFFERENTIAL
Basophils Relative: 0
Lymphs Abs: 2.3
Monocytes Relative: 7
Neutro Abs: 6.1
Neutrophils Relative %: 66

## 2011-03-31 LAB — CBC
HCT: 45
Hemoglobin: 15
MCHC: 33.3
MCV: 85.8
RDW: 13.5

## 2011-04-02 ENCOUNTER — Encounter: Payer: Self-pay | Admitting: Physician Assistant

## 2011-04-03 ENCOUNTER — Ambulatory Visit (INDEPENDENT_AMBULATORY_CARE_PROVIDER_SITE_OTHER): Payer: BC Managed Care – PPO | Admitting: Physician Assistant

## 2011-04-03 ENCOUNTER — Encounter: Payer: Self-pay | Admitting: Physician Assistant

## 2011-04-03 VITALS — BP 148/86 | HR 56 | Ht 72.0 in | Wt 268.0 lb

## 2011-04-03 DIAGNOSIS — G4730 Sleep apnea, unspecified: Secondary | ICD-10-CM

## 2011-04-03 DIAGNOSIS — I2510 Atherosclerotic heart disease of native coronary artery without angina pectoris: Secondary | ICD-10-CM

## 2011-04-03 DIAGNOSIS — G4733 Obstructive sleep apnea (adult) (pediatric): Secondary | ICD-10-CM | POA: Insufficient documentation

## 2011-04-03 DIAGNOSIS — R079 Chest pain, unspecified: Secondary | ICD-10-CM

## 2011-04-03 DIAGNOSIS — R0602 Shortness of breath: Secondary | ICD-10-CM | POA: Insufficient documentation

## 2011-04-03 DIAGNOSIS — E669 Obesity, unspecified: Secondary | ICD-10-CM | POA: Insufficient documentation

## 2011-04-03 DIAGNOSIS — R609 Edema, unspecified: Secondary | ICD-10-CM

## 2011-04-03 LAB — HEPATIC FUNCTION PANEL
ALT: 26 U/L (ref 0–53)
AST: 22 U/L (ref 0–37)
Albumin: 4.6 g/dL (ref 3.5–5.2)
Alkaline Phosphatase: 62 U/L (ref 39–117)
Total Protein: 8.4 g/dL — ABNORMAL HIGH (ref 6.0–8.3)

## 2011-04-03 LAB — CBC WITH DIFFERENTIAL/PLATELET
Basophils Absolute: 0 10*3/uL (ref 0.0–0.1)
Hemoglobin: 15.4 g/dL (ref 13.0–17.0)
Lymphocytes Relative: 38.5 % (ref 12.0–46.0)
Monocytes Relative: 8.9 % (ref 3.0–12.0)
Neutro Abs: 3.2 10*3/uL (ref 1.4–7.7)
Neutrophils Relative %: 49 % (ref 43.0–77.0)
Platelets: 253 10*3/uL (ref 150.0–400.0)
RDW: 13.3 % (ref 11.5–14.6)

## 2011-04-03 LAB — BASIC METABOLIC PANEL
Chloride: 103 mEq/L (ref 96–112)
Creatinine, Ser: 1.1 mg/dL (ref 0.4–1.5)
GFR: 97.86 mL/min (ref >60.00)

## 2011-04-03 LAB — TSH: TSH: 0.65 u[IU]/mL (ref 0.35–5.50)

## 2011-04-03 LAB — LIPID PANEL
Cholesterol: 153 mg/dL (ref 0–200)
HDL: 65.1 mg/dL (ref >39.00)
Triglycerides: 76 mg/dL (ref 0.0–149.0)

## 2011-04-03 LAB — D-DIMER, QUANTITATIVE: D-Dimer, Quant: <0.22 ug/mL-FEU (ref 0.00–0.48)

## 2011-04-03 LAB — BRAIN NATRIURETIC PEPTIDE: Pro B Natriuretic peptide (BNP): 34 pg/mL (ref 0.0–100.0)

## 2011-04-03 MED ORDER — NITROGLYCERIN 0.4 MG SL SUBL: 0.4000 mg | SUBLINGUAL_TABLET | SUBLINGUAL | Status: DC | PRN

## 2011-04-03 MED ORDER — OMEPRAZOLE 20 MG PO CPDR: 40.0000 mg | DELAYED_RELEASE_CAPSULE | Freq: Every day | ORAL | Status: DC

## 2011-04-03 MED ORDER — HYDROCHLOROTHIAZIDE 12.5 MG PO CAPS: 12.5000 mg | ORAL_CAPSULE | Freq: Every day | ORAL | Status: DC

## 2011-04-03 NOTE — Assessment & Plan Note (Signed)
Atypical.  However, he is one year out since his MI and he did have moderate stenosis in the OM 2.  Arrange stress Myoview.  Refill Prilosec.  Followup in 2-3 weeks with either me or Dr. Shirlee Latch.

## 2011-04-03 NOTE — Assessment & Plan Note (Signed)
Continue CPAP.  

## 2011-04-03 NOTE — Assessment & Plan Note (Signed)
He did have mild to moderate LVH and moderate diastolic dysfunction by echocardiogram one year ago.  However, he does not appear to be in CHF.  Check a BNP.  Check a d-dimer as noted.

## 2011-04-03 NOTE — Assessment & Plan Note (Signed)
Add HCTZ as noted.  Repeat basic metabolic panel in one week.

## 2011-04-03 NOTE — Progress Notes (Signed)
History of Present Illness: Primary Cardiologist:  Dr. Marca Ancona  [name] is a 45 y.o. male with a history of HTN and hyperlipidemia, CAD, s/p NSTEMI with DES to PDA.  He was admitted in 10/11 with chest pain/NSTEMI, and LHC showed 99% PDA stenosis which was treated with Promus DES.  EF was preserved on echo in 11/11.   Last seen in 3/12.    He notes recent history of depression with losing his job briefly as well as breaking up with his girlfriend.  He feels better now.  Denies suicidal ideations.  He is back to work. He currently has some unpaid bills with his PCP and is trying to get that straightened out so he can go back.  He needs a refill on his Prilosec.  He's noted some left lower extremity edema over the last several weeks.  He also notes some shortness of breath.  This seems to be mild and he describes class II symptoms.  He also notes occasional chest tightness.  This is nonexertional.  He feels some tingling in his left shoulder as well.  He denies any symptoms reminiscent of his previous MI.  He denies syncope.  He did have one episode of lightheadedness in the shower.  He denies orthopnea, PND.  He is taking all of his other medications.  Of note, he did miss his medicines this morning prior to coming in today.  Labs (10/11): K 3.9, creatinine 1.24 Labs (12/11): K 4, creatinine 1.1, LDL 75, HDL 67   Past Medical History: Reviewed history from 05/09/2010 and no changes required. 1. Hypertension 2. Hyperlipidemia 3. GERD 4. CAD: NSTEMI 10/11.  LHC showed 99% PDA, 60% OM2, EF 65%.  He had 2.25 x 15 Promus DES to PDA.  Echo (11/11) showed EF 60%, mild to moderate LVH, moderate diastolic dysfunction.  5. Prior smoker 6. h/o appendectomy 7.  Sleep apnea on CPAP   Current Outpatient Prescriptions  Medication Sig Dispense Refill  . amLODipine (NORVASC) 10 MG tablet TAKE ONE TABLET BY MOUTH EVERY DAY  30 tablet  6  . aspirin 325 MG tablet Take 325 mg by mouth daily.          [name] atorvastatin (LIPITOR) 80 MG tablet Take 1 tablet (80 mg total) by mouth daily.  30 tablet  6  . carvedilol (COREG) 12.5 MG tablet TAKE ONE TABLET BY MOUTH TWICE DAILY  60 tablet  6  . EFFIENT 10 MG TABS TAKE ONE TABLET BY MOUTH EVERY DAY  30 each  5  . lisinopril (PRINIVIL,ZESTRIL) 10 MG tablet TAKE ONE TABLET BY MOUTH EVERY DAY  30 tablet  12  . omeprazole (PRILOSEC) 40 MG capsule Take 20 mg by mouth daily.          Allergies: Allergies  Allergen Reactions  . Penicillins     Family History: No premature CAD.  Grandfathers both had CAD, diagnosed in their 87s and 65s.  DM.  Healthy siblings.   Social History: The patient smoked half-pack per day for 20 years, quit in 7/11. Restarted briefly recently then quit again. He denies use of alcohol or drugs.   He lives in [place].  Has children.  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   ROS:  Please see the history of present illness.  All other systems reviewed and negative.   Vital Signs: BP 148/86  Pulse 56  Ht 6' (1.829 m)  Wt 268 lb (121.564 kg)  BMI 36.35 kg/m2  PHYSICAL EXAM: Well nourished, well developed, in no acute distress HEENT: normal Neck: no JVD Endocrine: No thyromegaly Cardiac:  normal S1, S2; RRR; no murmur Lungs:  clear to auscultation bilaterally, no wheezing, rhonchi or rales Abd: soft, nontender, no hepatomegaly Ext: Trace left lower extremity edema; Calves soft; mild tenderness noted Skin: warm and dry Neuro:  CNs 2-12 intact, no focal abnormalities noted Psych: Normal affect  EKG:  Sinus brady, HR 56, normal axis, early repolarization, no ischemic changes.  ASSESSMENT AND PLAN:

## 2011-04-03 NOTE — Progress Notes (Signed)
Addended by: Reine Just on: 04/03/2011 09:18 AM   Modules accepted: Orders

## 2011-04-03 NOTE — Assessment & Plan Note (Signed)
Check lipids and LFTs today.  Goal LDL less than 70.

## 2011-04-03 NOTE — Assessment & Plan Note (Signed)
Continue aspirin, Effient and statin.  Proceed with stress testing as noted.  Followup in 2-3 weeks.

## 2011-04-03 NOTE — Patient Instructions (Addendum)
Your physician recommends that you schedule a follow-up appointment in: 2-3 weeks with Tereso Newcomer, PA-C on a day Dr Shirlee Latch is here or Dr Shirlee Latch  Your physician recommends that you return for lab work in: today and repeat BMP in 1 week Your physician has requested that you have en exercise stress myoview. For further information please visit [email or web address] Please follow instruction sheet, as given. Your physician has recommended you make the following change in your medication: START HCTZ 12.5 mg daily

## 2011-04-03 NOTE — Assessment & Plan Note (Signed)
We discussed weight loss. 

## 2011-04-03 NOTE — Assessment & Plan Note (Signed)
Suspect fluid retention from dietary indiscretion with salt.  However, it is unilateral.  Suspicion for DVT is low.  However, he did take a recent trip to the beach. He had some atypical chest pain as well as some shortness of breath.  I will get a d-dimer.  If this is abnormal, he will need a chest CT and venous Dopplers.  His blood pressure is uncontrolled.  I will place him on HCTZ 12.5 mg a day.  Check a basic metabolic panel, CBC, TSH, BNP.  Followup in 2-3 weeks as noted.

## 2011-04-08 ENCOUNTER — Telehealth: Payer: Self-pay | Admitting: Physician Assistant

## 2011-04-09 ENCOUNTER — Telehealth: Payer: Self-pay | Admitting: *Deleted

## 2011-04-09 DIAGNOSIS — E785 Hyperlipidemia, unspecified: Secondary | ICD-10-CM

## 2011-04-09 DIAGNOSIS — I2510 Atherosclerotic heart disease of native coronary artery without angina pectoris: Secondary | ICD-10-CM

## 2011-04-09 NOTE — Telephone Encounter (Signed)
lmom lab ok K+ borderline low, pt having repeat labs 04/10/11. Danielle Rankin

## 2011-04-10 ENCOUNTER — Other Ambulatory Visit (INDEPENDENT_AMBULATORY_CARE_PROVIDER_SITE_OTHER): Payer: BC Managed Care – PPO | Admitting: *Deleted

## 2011-04-10 DIAGNOSIS — I2510 Atherosclerotic heart disease of native coronary artery without angina pectoris: Secondary | ICD-10-CM

## 2011-04-10 DIAGNOSIS — E785 Hyperlipidemia, unspecified: Secondary | ICD-10-CM

## 2011-04-10 DIAGNOSIS — R079 Chest pain, unspecified: Secondary | ICD-10-CM

## 2011-04-10 LAB — HEPATIC FUNCTION PANEL
Alkaline Phosphatase: 61 U/L (ref 39–117)
Bilirubin, Direct: 0.1 mg/dL (ref 0.0–0.3)

## 2011-04-10 LAB — BASIC METABOLIC PANEL
Calcium: 8.9 mg/dL (ref 8.4–10.5)
Creatinine, Ser: 1.2 mg/dL (ref 0.4–1.5)
GFR: 88.1 mL/min (ref >60.00)
Sodium: 140 mEq/L (ref 135–145)

## 2011-04-13 ENCOUNTER — Telehealth: Payer: Self-pay | Admitting: *Deleted

## 2011-04-13 DIAGNOSIS — I10 Essential (primary) hypertension: Secondary | ICD-10-CM

## 2011-04-13 MED ORDER — POTASSIUM CHLORIDE CRYS ER 20 MEQ PO TBCR: 20.0000 meq | EXTENDED_RELEASE_TABLET | Freq: Every day | ORAL | Status: DC

## 2011-04-13 NOTE — Telephone Encounter (Signed)
pt aware of lab results and to start K+ 20 meq daily, rx sent in today to [facility] on [address] pt aware. Danielle Rankin Lab order placed for 04/23/11 bmet. Danielle Rankin

## 2011-04-16 ENCOUNTER — Ambulatory Visit (HOSPITAL_COMMUNITY): Payer: BC Managed Care – PPO | Attending: Cardiology | Admitting: Radiology

## 2011-04-16 VITALS — Ht 72.0 in | Wt 267.0 lb

## 2011-04-16 DIAGNOSIS — R0789 Other chest pain: Secondary | ICD-10-CM

## 2011-04-16 DIAGNOSIS — R079 Chest pain, unspecified: Secondary | ICD-10-CM | POA: Insufficient documentation

## 2011-04-16 DIAGNOSIS — R0609 Other forms of dyspnea: Secondary | ICD-10-CM

## 2011-04-16 DIAGNOSIS — I4949 Other premature depolarization: Secondary | ICD-10-CM

## 2011-04-16 DIAGNOSIS — I2510 Atherosclerotic heart disease of native coronary artery without angina pectoris: Secondary | ICD-10-CM

## 2011-04-16 MED ORDER — TECHNETIUM TC 99M TETROFOSMIN IV KIT
33.0000 | PACK | Freq: Once | INTRAVENOUS | Status: AC | PRN
  Administered 2011-04-16: 33 via INTRAVENOUS

## 2011-04-16 MED ORDER — TECHNETIUM TC 99M TETROFOSMIN IV KIT
11.0000 | PACK | Freq: Once | INTRAVENOUS | Status: AC | PRN
  Administered 2011-04-16: 11 via INTRAVENOUS

## 2011-04-16 NOTE — Progress Notes (Signed)
[facility] SITE 3 NUCLEAR MED [address] [place] [address] [phone]  Cardiology Nuclear Med Study  [name] is a 45 y.o. male [id number] 1965/11/19   Nuclear Med Background Indication for Stress Test:  Evaluation for Ischemia and Stent Patency History:  10/11 NSTEMI>Stent-PDA, EF=65%; 11/11 Echo:EF=60%, mild to moderate LVH Cardiac Risk Factors: History of Smoking, Hypertension, Lipids and Obesity  Symptoms:  Chest Tightness (last episode of chest discomfort was this a.m., 5/10; none now), Dizziness, DOE, Light-Headedness, Palpitations and Lower Extremity Edema   Nuclear Pre-Procedure Caffeine/Decaff Intake:  None NPO After: 7:00pm   Lungs:  Clear.  IV 0.9% NS with Angio Cath:  20g  IV Site: R Antecubital  IV Started by:  Stanton Kidney, EMT-P  Chest Size (in):  54 Cup Size: n/a  Height: 6' (1.829 m)  Weight:  267 lb (121.11 kg)  BMI:  Body mass index is 36.21 kg/(m^2). Tech Comments:  Coreg held > 24 hours, per patient.    Nuclear Med Study 1 or 2 day study: 1 day  Stress Test Type:  Stress  Reading MD: Willa Rough, MD  Order Authorizing Provider:  Marca Ancona, MD; Tereso Newcomer, PA-C  Resting Radionuclide: Technetium 71m Tetrofosmin  Resting Radionuclide Dose: 11.0 mCi   Stress Radionuclide:  Technetium 67m Tetrofosmin  Stress Radionuclide Dose: 33.0 mCi           Stress Protocol Rest HR: 52 Stress HR: 162  Rest BP: 131/88 Stress BP: 208/98  Exercise Time (min): 9:00 METS: 10.1   Predicted Max HR: 175 bpm % Max HR: 92.57 bpm Rate Pressure Product: 21308   Dose of Adenosine (mg):  n/a Dose of Lexiscan: n/a mg  Dose of Atropine (mg): n/a Dose of Dobutamine: n/a mcg/kg/min (at max HR)  Stress Test Technologist: Smiley Houseman, CMA-N  Nuclear Technologist:  Doyne Keel, CNMT     Rest Procedure:  Myocardial perfusion imaging was performed at rest 45 minutes following the intravenous administration of Technetium 86m  Tetrofosmin.  Rest ECG: No acute changes.  Stress Procedure:  The patient exercised for nine minutes on the treadmill utilizing the Bruce prorotocl.  The patient stopped due to fatigue.  He did c/o chest pressure, 5/10, during exercise.  There were no diagnostic ST-T wave changes.  There were occasional PVC's with couplets.  He also had a mild hypertensive response to exercise, 208/98.  Technetium 33m Tetrofosmin was injected at peak exercise and myocardial perfusion imaging was performed after a brief delay.  Stress ECG: No significant change from baseline ECG  QPS Raw Data Images:  Normal; no motion artifact; normal heart/lung ratio. Stress Images:  Normal homogeneous uptake in all areas of the myocardium. Rest Images:  Normal homogeneous uptake in all areas of the myocardium. Subtraction (SDS):  No evidence of ischemia. Transient Ischemic Dilatation (Normal <1.22):  0.95 Lung/Heart Ratio (Normal <0.45):  0.26  Quantitative Gated Spect Images QGS EDV:  138 ml QGS ESV:  54 ml QGS cine images:  Normal Wall Motion QGS EF: 61%  Impression Exercise Capacity:  Fair exercise capacity. BP Response:  Hypertensive blood pressure response. Clinical Symptoms:  5/10 chest pain ECG Impression:  No significant ST segment change suggestive of ischemia. Comparison with Prior Nuclear Study: No previous nuclear study performed  Overall Impression:  The patient had chest pain. There was no significant EKG change. The nuclear images are normal. There is no scar or ischemia.   Willa Rough

## 2011-04-23 ENCOUNTER — Other Ambulatory Visit: Payer: BC Managed Care – PPO | Admitting: *Deleted

## 2011-04-23 ENCOUNTER — Ambulatory Visit (INDEPENDENT_AMBULATORY_CARE_PROVIDER_SITE_OTHER): Payer: BC Managed Care – PPO | Admitting: Cardiology

## 2011-04-23 ENCOUNTER — Encounter: Payer: Self-pay | Admitting: Cardiology

## 2011-04-23 VITALS — BP 127/85 | HR 68 | Ht 72.0 in | Wt 269.1 lb

## 2011-04-23 DIAGNOSIS — N529 Male erectile dysfunction, unspecified: Secondary | ICD-10-CM

## 2011-04-23 DIAGNOSIS — E785 Hyperlipidemia, unspecified: Secondary | ICD-10-CM

## 2011-04-23 DIAGNOSIS — I10 Essential (primary) hypertension: Secondary | ICD-10-CM

## 2011-04-23 DIAGNOSIS — I2510 Atherosclerotic heart disease of native coronary artery without angina pectoris: Secondary | ICD-10-CM

## 2011-04-23 MED ORDER — SILDENAFIL CITRATE 25 MG PO TABS: ORAL_TABLET | ORAL | Status: DC

## 2011-04-23 NOTE — Patient Instructions (Signed)
You can stop Effient when you finish your current supply.  Decrease aspirin to 162mg  daily--this will be two 81mg  tablets daily.  Use Viagra 25-50mg  as needed. DO NOT USE NITROGLYCERIN WITHIN 24 HOURS OF TAKING VIAGRA.  Your physician wants you to follow-up in: 6 months with Dr Shirlee Latch. (April 2013). You will receive a reminder letter in the mail two months in advance. If you don't receive a letter, please call our office to schedule the follow-up appointment.

## 2011-04-24 ENCOUNTER — Telehealth: Payer: Self-pay | Admitting: Cardiology

## 2011-04-24 LAB — BASIC METABOLIC PANEL
BUN: 12 mg/dL (ref 6–23)
CO2: 27 mEq/L (ref 19–32)
Chloride: 105 mEq/L (ref 96–112)
Creatinine, Ser: 1.2 mg/dL (ref 0.4–1.5)
Potassium: 3.6 mEq/L (ref 3.5–5.1)

## 2011-04-24 NOTE — Telephone Encounter (Signed)
Lab results given to pt.

## 2011-04-24 NOTE — Telephone Encounter (Signed)
Pt returning your call

## 2011-04-26 DIAGNOSIS — N529 Male erectile dysfunction, unspecified: Secondary | ICD-10-CM | POA: Insufficient documentation

## 2011-04-26 NOTE — Assessment & Plan Note (Signed)
This could be due to his cardiac meds (versus vascular disease).  I will give him a prescription for Viagra.  He understands not to use any nitroglycerin products within 24 hours (he does not have NTG at home).

## 2011-04-26 NOTE — Progress Notes (Signed)
PCP: Dr. Anna Genre  45 yo with history of HTN and hyperlipidemia presents for followup after NSTEMI with DES to PDA. He was admitted in 10/11 with chest pain/NSTEMI, and LHC showed 99% PDA stenosis which was treated with Promus DES. EF was preserved on echo in 11/11.  Patient saw Tereso Newcomer in this office last month.  He had had a stressful few weeks (broke up with girlfriend, lost job) and he developed atypical chest pain and mild exertional dyspnea.  He did an ETT-myoview this month, with 9 minutes of exercise, normal EF, and no evidence of ischemia or infarction.  He has since gotten his job back and has a new girlfriend.  He has had no further chest pain or exertional dyspnea.  BP has been doing well, running in the 120s systolic at home.  He does complain of erectile dysfunction.   Labs (10/11): K 3.9, creatinine 1.24  Labs (12/11): K 4, creatinine 1.1, LDL 75, HDL 67  Labs (10/12): K 3.3, creatinine 1.2, D dimer normal, BNP 34, TSH normal, LDL 73, HDL 65  Allergies:  No Known Drug Allergies   Past Medical History:   1. Hypertension  2. Hyperlipidemia  3. GERD  4. CAD: NSTEMI 10/11. LHC showed 99% PDA, 60% OM2, EF 65%. He had 2.25 x 15 Promus DES to PDA. Echo (11/11) showed EF 60%, mild to moderate LVH, moderate diastolic dysfunction.  ETT-myoview (10/12): 9' exercise, no ECG changes, EF 61%, normal perfusion images. 5. Prior smoker  6. h/o appendectomy  7. OSA on CPAP 8. Erectile dysfunction  Family History:  No premature CAD. Grandfathers both had CAD, diagnosed in their 34s and 50s. DM. Healthy siblings.   Social History:  The patient smoked half-pack per day for 20 years, quit in 7/11.  He denies use of alcohol or drugs.  He lives in [place]. Has children. 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   ROS: All systems reviewed and negative except as per HPI.   Current Outpatient Prescriptions  Medication Sig Dispense Refill  . amLODipine (NORVASC) 10 MG tablet TAKE  ONE TABLET BY MOUTH EVERY DAY  30 tablet  6  . atorvastatin (LIPITOR) 80 MG tablet Take 1 tablet (80 mg total) by mouth daily.  30 tablet  6  . carvedilol (COREG) 12.5 MG tablet TAKE ONE TABLET BY MOUTH TWICE DAILY  60 tablet  6  . hydrochlorothiazide (MICROZIDE) 12.5 MG capsule Take 1 capsule (12.5 mg total) by mouth daily.  30 capsule  5  . lisinopril (PRINIVIL,ZESTRIL) 10 MG tablet TAKE ONE TABLET BY MOUTH EVERY DAY  30 tablet  12  . nitroGLYCERIN (NITROSTAT) 0.4 MG SL tablet Place 1 tablet (0.4 mg total) under the tongue every 5 (five) minutes as needed for chest pain.  25 tablet  11  . omeprazole (PRILOSEC) 20 MG capsule Take 2 capsules (40 mg total) by mouth daily.  60 capsule  5  . potassium chloride SA (KLOR-CON M20) 20 MEQ tablet Take 1 tablet (20 mEq total) by mouth daily.  30 tablet  11  . aspirin EC 81 MG tablet Take 2 tablets daily      . sildenafil (VIAGRA) 25 MG tablet Take 1-2 tablets as needed. DO NOT USE NITROGLYCERIN WITHIN 24 HOURS OF TAKING VIAGRA  20 tablet  1    BP 127/85  Pulse 68  Ht 6' (1.829 m)  Wt 269 lb 1.9 oz (122.072 kg)  BMI 36.50 kg/m2 General: NAD Neck: Thick, No JVD, no  thyromegaly or thyroid nodule.  Lungs: Clear to auscultation bilaterally with normal respiratory effort. CV: Nondisplaced PMI.  Heart regular S1/S2, no S3/S4, no murmur.  Trace ankle edema.  No carotid bruit.  Normal pedal pulses.  Abdomen: Soft, nontender, no hepatosplenomegaly, no distention.  Neurologic: Alert and oriented x 3.  Psych: Normal affect. Extremities: No clubbing or cyanosis.

## 2011-04-26 NOTE — Assessment & Plan Note (Signed)
Negative myoview this month with no further chest pain.  I suspect that his symptoms last month were noncardiac and possibly related to stress.  - OK to stop Effient after this month.  - May decrease ASA to 162 mg daily. - Continue statin, Coreg, lisinopril.

## 2011-04-26 NOTE — Assessment & Plan Note (Signed)
BP is now under good control.  Continue current meds.

## 2011-04-26 NOTE — Assessment & Plan Note (Signed)
Lipids near goal (< 70) when checked this month.

## 2011-09-06 ENCOUNTER — Other Ambulatory Visit: Payer: Self-pay | Admitting: Cardiology

## 2011-09-07 ENCOUNTER — Other Ambulatory Visit: Payer: Self-pay | Admitting: Cardiology

## 2011-09-07 MED ORDER — CARVEDILOL 12.5 MG PO TABS: 12.5000 mg | ORAL_TABLET | Freq: Two times a day (BID) | ORAL | Status: DC

## 2011-09-07 MED ORDER — ATORVASTATIN CALCIUM 80 MG PO TABS: 80.0000 mg | ORAL_TABLET | Freq: Every day | ORAL | Status: DC

## 2011-09-07 NOTE — Telephone Encounter (Signed)
New refill Pt called to get refill of carvedilol and atorvastatin. He made appt with dr Shirlee Latch for [phone] Please call into [facility] [address]

## 2011-09-07 NOTE — Telephone Encounter (Signed)
..   Requested Prescriptions   Pending Prescriptions Disp Refills  . atorvastatin (LIPITOR) 80 MG tablet [Pharmacy Med Name: ATORVASTATIN 80MG    TAB] 30 tablet 1    Sig: TAKE ONE TABLET BY MOUTH EVERY DAY  . carvedilol (COREG) 12.5 MG tablet [Pharmacy Med Name: CARVEDILOL 12.5MG    TAB] 60 tablet 1    Sig: TAKE ONE TABLET BY MOUTH TWICE DAILY  Patient needs to call in for appointment

## 2011-10-08 ENCOUNTER — Other Ambulatory Visit: Payer: Self-pay | Admitting: Cardiology

## 2011-10-08 ENCOUNTER — Other Ambulatory Visit: Payer: Self-pay | Admitting: Physician Assistant

## 2011-10-08 NOTE — Telephone Encounter (Signed)
Refilled hctz 

## 2011-10-08 NOTE — Telephone Encounter (Signed)
Refilled amlodipine 

## 2011-10-09 ENCOUNTER — Ambulatory Visit: Payer: BC Managed Care – PPO | Admitting: Cardiology

## 2011-10-14 ENCOUNTER — Encounter: Payer: Self-pay | Admitting: *Deleted

## 2011-10-14 ENCOUNTER — Encounter: Payer: Self-pay | Admitting: Physician Assistant

## 2011-10-14 ENCOUNTER — Ambulatory Visit (INDEPENDENT_AMBULATORY_CARE_PROVIDER_SITE_OTHER): Payer: BC Managed Care – PPO | Admitting: Physician Assistant

## 2011-10-14 VITALS — BP 126/79 | HR 60 | Ht 72.0 in | Wt 275.8 lb

## 2011-10-14 DIAGNOSIS — E785 Hyperlipidemia, unspecified: Secondary | ICD-10-CM

## 2011-10-14 DIAGNOSIS — I10 Essential (primary) hypertension: Secondary | ICD-10-CM

## 2011-10-14 DIAGNOSIS — I2510 Atherosclerotic heart disease of native coronary artery without angina pectoris: Secondary | ICD-10-CM

## 2011-10-14 MED ORDER — ATORVASTATIN CALCIUM 80 MG PO TABS: 80.0000 mg | ORAL_TABLET | Freq: Every day | ORAL | Status: DC

## 2011-10-14 NOTE — Progress Notes (Signed)
[address] [place], [place]  [address] Phone: [phone] Fax:  [phone]  Date:  10/14/2011   Name:  [name]       DOB:  1966/01/06 MRN:  [id number]  PCP:  Lonie Peak, PA-C Primary Cardiologist:  Dr. Marca Ancona  Primary Electrophysiologist:  None    History of Present Illness: [name] is a 46 y.o. male who presents for follow up.  He has a history of HTN and hyperlipidemia, CAD s/p NSTEMI treated with DES to PDA. He was admitted in 10/11 with chest pain/NSTEMI, and LHC showed 99% PDA stenosis which was treated with Promus DES. EF was preserved on echo in 11/11. ETT-myoview 03/2011:  normal EF, and no evidence of ischemia or infarction.  He presents for 6 mo follow up.  Overall doing well.  He has occ left chest and should pain that is related to LUE motion.  He works in a Museum/gallery curator.  He denies exertional chest pain or symptoms similar to prior angina.  No orthopnea, PND or edema.  No syncope.  He is not smoking.  Past Medical History  Diagnosis Date  . Hypertension   . Hyperlipidemia   . GERD (gastroesophageal reflux disease)   . Coronary artery disease     NSTEMI 10/11 LHC showed 99% OM2, EF 65%. He had 2.25 X 15 promus DES to PDA. Echo (11/11) showed EF 60% mild to moderate LVH, moderate diastolic dysfunction;  ETT-myoview (10/12): 9' exercise, no ECG changes, EF 61%, normal perfusion images.   [name] Ex-smoker   . S/P appendectomy   . OSA (obstructive sleep apnea)     CPAP  . ED (erectile dysfunction)      Current Outpatient Prescriptions  Medication Sig Dispense Refill  . amLODipine (NORVASC) 10 MG tablet TAKE ONE TABLET BY MOUTH EVERY DAY  30 tablet  5  . aspirin EC 81 MG tablet Take 2 tablets daily      . carvedilol (COREG) 12.5 MG tablet Take 1 tablet (12.5 mg total) by mouth 2 (two) times daily with a meal.  60 tablet  1  . hydrochlorothiazide (MICROZIDE) 12.5 MG capsule TAKE ONE CAPSULE BY MOUTH EVERY DAY  30  capsule  5  . lisinopril (PRINIVIL,ZESTRIL) 10 MG tablet TAKE ONE TABLET BY MOUTH EVERY DAY  30 tablet  12  . nitroGLYCERIN (NITROSTAT) 0.4 MG SL tablet Place 1 tablet (0.4 mg total) under the tongue every 5 (five) minutes as needed for chest pain.  25 tablet  11  . omeprazole (PRILOSEC) 20 MG capsule Take 2 capsules (40 mg total) by mouth daily.  60 capsule  5  . potassium chloride SA (KLOR-CON M20) 20 MEQ tablet Take 1 tablet (20 mEq total) by mouth daily.  30 tablet  11  . sildenafil (VIAGRA) 25 MG tablet Take 1-2 tablets as needed. DO NOT USE NITROGLYCERIN WITHIN 24 HOURS OF TAKING VIAGRA  20 tablet  1    Allergies: Allergies  Allergen Reactions  . Penicillins     History  Substance Use Topics  . Smoking status: Former Games developer  . Smokeless tobacco: Not on file  . Alcohol Use: No     PHYSICAL EXAM: VS:  BP 126/79  Pulse 60  Ht 6' (1.829 m)  Wt 275 lb 12.8 oz (125.102 kg)  BMI 37.41 kg/m2 Well nourished, well developed, in no acute distress HEENT: normal Neck: no JVD Cardiac:  normal S1, S2; RRR; no murmur Lungs:  clear  to auscultation bilaterally, no wheezing, rhonchi or rales Abd: soft, nontender, no hepatomegaly Ext: no edema Skin: warm and dry Neuro:  CNs 2-12 intact, no focal abnormalities noted  EKG:  NSR, HR 67, normal axis, j point elevation, non-specific ST-T changes  ASSESSMENT AND PLAN:  1. Coronary Artery Disease  Doing well.  No angina.  He has some MSK chest pain.  Continue ASA, statin.  Follow up with Dr. Marca Ancona in 6 mos.   2. Hyperlipidemia  Continue Lipitor.   3. Hypertension   Controlled.  Continue current therapy.       Luna Glasgow, PA-C  3:39 PM 10/14/2011

## 2011-10-14 NOTE — Patient Instructions (Signed)
Your physician has recommended you make the following change in your medication: START LIPITOR 80 MG EVERY NIGHT  Your physician recommends that you return for lab work in: TODAY BMET 401.1  Your physician recommends that you schedule a follow-up appointment in: 6 MONTHS WITH DR. Shirlee Latch

## 2011-10-15 LAB — BASIC METABOLIC PANEL
BUN: 11 mg/dL (ref 6–23)
CO2: 28 mEq/L (ref 19–32)
GFR: 96.56 mL/min (ref >60.00)
Glucose, Bld: 100 mg/dL — ABNORMAL HIGH (ref 70–99)
Potassium: 3.1 mEq/L — ABNORMAL LOW (ref 3.5–5.1)
Sodium: 140 mEq/L (ref 135–145)

## 2011-10-16 ENCOUNTER — Encounter: Payer: Self-pay | Admitting: *Deleted

## 2011-10-16 ENCOUNTER — Telehealth: Payer: Self-pay | Admitting: *Deleted

## 2011-10-16 DIAGNOSIS — I10 Essential (primary) hypertension: Secondary | ICD-10-CM

## 2011-10-16 MED ORDER — POTASSIUM CHLORIDE CRYS ER 20 MEQ PO TBCR: 40.0000 meq | EXTENDED_RELEASE_TABLET | Freq: Every day | ORAL | Status: DC

## 2011-10-16 NOTE — Telephone Encounter (Signed)
Increased K+ dose sent to pharmacy today

## 2011-10-16 NOTE — Telephone Encounter (Signed)
tried x 3 now to reach pt. I will send out a result letter with recommendations per Tereso Newcomer, PA. Danielle Rankin

## 2011-10-16 NOTE — Telephone Encounter (Signed)
tried x 3 now to reach pt. I will send out a result letter with recommendations per Tereso Newcomer, PA

## 2011-10-16 NOTE — Telephone Encounter (Signed)
Message copied by Tarri Fuller on Fri Oct 16, 2011  9:26 AM ------      Message from: [place], [place] T      Created: Fri Oct 16, 2011  5:42 AM       Med list indicates he takes K+ 20 mEq daily         -  Confirm that he takes this daily  => if NOT, resume taking daily      If he is taking, increase K+ to 40 mEq daily      Repeat BMET in one week      Tereso Newcomer, PA-C  5:42 AM 10/16/2011

## 2011-10-16 NOTE — Telephone Encounter (Signed)
Tried x 2 now to reach pt. I will try again later today. [name]

## 2011-10-16 NOTE — Telephone Encounter (Signed)
Message copied by Tarri Fuller on Fri Oct 16, 2011  9:29 AM ------      Message from: [place], [place] T      Created: Fri Oct 16, 2011  5:42 AM       Med list indicates he takes K+ 20 mEq daily         -  Confirm that he takes this daily  => if NOT, resume taking daily      If he is taking, increase K+ to 40 mEq daily      Repeat BMET in one week      Tereso Newcomer, PA-C  5:42 AM 10/16/2011

## 2011-10-16 NOTE — Telephone Encounter (Signed)
lmom on both home and mobile # for ptcb to go over lab results and to increase K+ to 40. Danielle Rankin

## 2011-10-19 ENCOUNTER — Telehealth: Payer: Self-pay | Admitting: Physician Assistant

## 2011-10-19 NOTE — Telephone Encounter (Deleted)
Hgb (09/14/11): 13.2 TSH (09/14/11): 1.49  INR (07/09/11):  2.3 INR (08/19/11):  2.7 INR (09/14/11):  2.2

## 2011-10-23 ENCOUNTER — Ambulatory Visit: Payer: BC Managed Care – PPO | Admitting: *Deleted

## 2011-10-23 DIAGNOSIS — I10 Essential (primary) hypertension: Secondary | ICD-10-CM

## 2011-10-24 ENCOUNTER — Telehealth: Payer: Self-pay | Admitting: Physician Assistant

## 2011-10-24 LAB — BASIC METABOLIC PANEL
BUN: 13 mg/dL (ref 6–23)
CO2: 26 mEq/L (ref 19–32)
Calcium: 9.1 mg/dL (ref 8.4–10.5)
Chloride: 103 mEq/L (ref 96–112)
Creat: 1.15 mg/dL (ref 0.50–1.35)

## 2011-10-24 NOTE — Telephone Encounter (Signed)
I left a message for the patient on his voicemail, after receiving a call from [facility] laboratory regarding a critical glucose level of 36. I advised him to immediately contact his primary M.D., for further recommendations. In the meanwhile, I advised him to hold any DM medications, if he is on any. I also instructed him to drink juices, pending further instructions. He is to contact me for any worsening symptoms.

## 2011-10-25 NOTE — Telephone Encounter (Signed)
Error

## 2011-11-06 ENCOUNTER — Other Ambulatory Visit: Payer: Self-pay | Admitting: Physician Assistant

## 2011-12-07 ENCOUNTER — Other Ambulatory Visit: Payer: Self-pay | Admitting: Cardiovascular Disease

## 2012-02-11 ENCOUNTER — Other Ambulatory Visit: Payer: Self-pay | Admitting: Physician Assistant

## 2012-02-11 ENCOUNTER — Other Ambulatory Visit: Payer: Self-pay | Admitting: Cardiology

## 2012-02-11 ENCOUNTER — Other Ambulatory Visit (HOSPITAL_COMMUNITY): Payer: Self-pay | Admitting: *Deleted

## 2012-02-11 MED ORDER — OMEPRAZOLE 20 MG PO CPDR: 20.0000 mg | DELAYED_RELEASE_CAPSULE | Freq: Two times a day (BID) | ORAL | Status: DC

## 2012-02-11 MED ORDER — CARVEDILOL 12.5 MG PO TABS: 12.5000 mg | ORAL_TABLET | Freq: Two times a day (BID) | ORAL | Status: DC

## 2012-04-08 ENCOUNTER — Other Ambulatory Visit (INDEPENDENT_AMBULATORY_CARE_PROVIDER_SITE_OTHER): Payer: BC Managed Care – PPO

## 2012-04-08 DIAGNOSIS — I10 Essential (primary) hypertension: Secondary | ICD-10-CM

## 2012-04-08 LAB — BASIC METABOLIC PANEL
BUN: 12 mg/dL (ref 6–23)
Calcium: 8.8 mg/dL (ref 8.4–10.5)
GFR: 96.36 mL/min (ref >60.00)
Glucose, Bld: 97 mg/dL (ref 70–99)
Potassium: 3.8 mEq/L (ref 3.5–5.1)

## 2012-04-09 ENCOUNTER — Other Ambulatory Visit: Payer: Self-pay | Admitting: Cardiology

## 2012-04-11 ENCOUNTER — Telehealth: Payer: Self-pay | Admitting: *Deleted

## 2012-04-11 NOTE — Telephone Encounter (Signed)
Follow-up:    Patient called to receive the results of his lab work.  Please call back.

## 2012-04-11 NOTE — Telephone Encounter (Signed)
Message copied by Tarri Fuller on Mon Apr 11, 2012  9:30 AM ------      Message from: [place], [place] T      Created: Sun Apr 10, 2012  7:55 PM       [facility]      Continue with current treatment plan.      Tereso Newcomer, PA-C  7:55 PM 04/10/2012

## 2012-04-11 NOTE — Telephone Encounter (Signed)
pt notified about lab results w/verbal understanding 

## 2012-04-11 NOTE — Telephone Encounter (Signed)
New Problem:   Patient returned your call.  Please call back after 3:40pm today.

## 2012-04-11 NOTE — Telephone Encounter (Signed)
I called home # and a man answered and when I asked for pt he said pt did not live there. I then tried the cell # but could not lm. was callin gto let pt know of lab results

## 2012-09-09 ENCOUNTER — Other Ambulatory Visit: Payer: Self-pay | Admitting: Cardiovascular Disease

## 2012-09-09 ENCOUNTER — Other Ambulatory Visit (HOSPITAL_COMMUNITY): Payer: Self-pay | Admitting: Cardiology

## 2012-10-13 ENCOUNTER — Other Ambulatory Visit (HOSPITAL_COMMUNITY): Payer: Self-pay | Admitting: Cardiology

## 2012-10-18 ENCOUNTER — Other Ambulatory Visit (HOSPITAL_COMMUNITY): Payer: Self-pay | Admitting: Cardiology

## 2012-11-17 ENCOUNTER — Other Ambulatory Visit: Payer: Self-pay | Admitting: Physician Assistant

## 2012-11-17 ENCOUNTER — Other Ambulatory Visit (HOSPITAL_COMMUNITY): Payer: Self-pay | Admitting: Cardiology

## 2012-11-22 ENCOUNTER — Telehealth: Payer: Self-pay

## 2012-11-22 NOTE — Telephone Encounter (Signed)
reciveved refill rqst for atorvasatin, tried to verify with pt if hes still taking. called pt mobile number no answer unable to leave a message.called home number listed a gentleman answered and said the pt does not live there.

## 2012-12-18 ENCOUNTER — Other Ambulatory Visit: Payer: Self-pay | Admitting: Cardiology

## 2012-12-19 ENCOUNTER — Other Ambulatory Visit: Payer: Self-pay | Admitting: *Deleted

## 2012-12-19 MED ORDER — HYDROCHLOROTHIAZIDE 12.5 MG PO CAPS: ORAL_CAPSULE | ORAL | Status: DC

## 2012-12-19 MED ORDER — OMEPRAZOLE 20 MG PO CPDR: DELAYED_RELEASE_CAPSULE | ORAL | Status: DC

## 2012-12-19 MED ORDER — AMLODIPINE BESYLATE 10 MG PO TABS: ORAL_TABLET | ORAL | Status: DC

## 2012-12-27 ENCOUNTER — Other Ambulatory Visit: Payer: Self-pay | Admitting: *Deleted

## 2012-12-27 MED ORDER — CARVEDILOL 12.5 MG PO TABS: ORAL_TABLET | ORAL | Status: DC

## 2013-01-11 ENCOUNTER — Other Ambulatory Visit: Payer: Self-pay | Admitting: Cardiology

## 2013-01-22 ENCOUNTER — Other Ambulatory Visit: Payer: Self-pay | Admitting: Cardiology

## 2013-01-23 ENCOUNTER — Other Ambulatory Visit: Payer: Self-pay | Admitting: *Deleted

## 2013-01-23 MED ORDER — ATORVASTATIN CALCIUM 80 MG PO TABS: ORAL_TABLET | ORAL | Status: DC

## 2013-01-23 MED ORDER — POTASSIUM CHLORIDE CRYS ER 20 MEQ PO TBCR: EXTENDED_RELEASE_TABLET | ORAL | Status: DC

## 2013-01-24 NOTE — Telephone Encounter (Signed)
I looked at all the notes on this pt from the last OV to the recent phone note, leaves me to ask should we fill these meds?

## 2013-01-30 ENCOUNTER — Other Ambulatory Visit: Payer: Self-pay | Admitting: *Deleted

## 2013-01-30 MED ORDER — CARVEDILOL 12.5 MG PO TABS: ORAL_TABLET | ORAL | Status: DC

## 2013-01-30 MED ORDER — HYDROCHLOROTHIAZIDE 12.5 MG PO CAPS: ORAL_CAPSULE | ORAL | Status: DC

## 2013-01-30 MED ORDER — OMEPRAZOLE 20 MG PO CPDR: DELAYED_RELEASE_CAPSULE | ORAL | Status: DC

## 2013-01-30 MED ORDER — AMLODIPINE BESYLATE 10 MG PO TABS: ORAL_TABLET | ORAL | Status: DC

## 2013-02-15 ENCOUNTER — Telehealth: Payer: Self-pay | Admitting: Cardiology

## 2013-02-15 NOTE — Telephone Encounter (Addendum)
Pt's wife made appt 04-05-13 415pm, needed late pm , for fu to get refills, needs all  heart meds refilled, uses [facility] [address]

## 2013-02-16 ENCOUNTER — Other Ambulatory Visit: Payer: Self-pay | Admitting: Cardiology

## 2013-02-16 MED ORDER — AMLODIPINE BESYLATE 10 MG PO TABS: ORAL_TABLET | ORAL | Status: DC

## 2013-02-16 MED ORDER — HYDROCHLOROTHIAZIDE 12.5 MG PO CAPS: ORAL_CAPSULE | ORAL | Status: DC

## 2013-02-16 MED ORDER — CARVEDILOL 12.5 MG PO TABS: ORAL_TABLET | ORAL | Status: DC

## 2013-02-16 MED ORDER — POTASSIUM CHLORIDE CRYS ER 20 MEQ PO TBCR: EXTENDED_RELEASE_TABLET | ORAL | Status: DC

## 2013-02-16 MED ORDER — ATORVASTATIN CALCIUM 80 MG PO TABS: ORAL_TABLET | ORAL | Status: DC

## 2013-02-16 MED ORDER — LISINOPRIL 10 MG PO TABS: ORAL_TABLET | ORAL | Status: DC

## 2013-02-16 NOTE — Telephone Encounter (Signed)
Refills done.

## 2013-02-16 NOTE — Telephone Encounter (Signed)
LMVM refills done

## 2013-03-05 ENCOUNTER — Inpatient Hospital Stay (HOSPITAL_COMMUNITY)
Admission: EM | Admit: 2013-03-05 | Discharge: 2013-03-07 | DRG: 083 | Disposition: A | Discharge status: Home / Self Care | Payer: BC Managed Care – PPO | Attending: General Surgery | Admitting: General Surgery

## 2013-03-05 ENCOUNTER — Emergency Department (HOSPITAL_COMMUNITY): Payer: BC Managed Care – PPO

## 2013-03-05 ENCOUNTER — Inpatient Hospital Stay (HOSPITAL_COMMUNITY): Payer: BC Managed Care – PPO

## 2013-03-05 ENCOUNTER — Encounter (HOSPITAL_COMMUNITY): Payer: Self-pay | Admitting: Family Medicine

## 2013-03-05 DIAGNOSIS — M542 Cervicalgia: Secondary | ICD-10-CM

## 2013-03-05 DIAGNOSIS — E782 Mixed hyperlipidemia: Secondary | ICD-10-CM | POA: Diagnosis present

## 2013-03-05 DIAGNOSIS — S2220XA Unspecified fracture of sternum, initial encounter for closed fracture: Principal | ICD-10-CM | POA: Diagnosis present

## 2013-03-05 DIAGNOSIS — I10 Essential (primary) hypertension: Secondary | ICD-10-CM | POA: Diagnosis present

## 2013-03-05 DIAGNOSIS — S82109A Unspecified fracture of upper end of unspecified tibia, initial encounter for closed fracture: Secondary | ICD-10-CM | POA: Diagnosis present

## 2013-03-05 DIAGNOSIS — S82009A Unspecified fracture of unspecified patella, initial encounter for closed fracture: Secondary | ICD-10-CM

## 2013-03-05 DIAGNOSIS — Z79899 Other long term (current) drug therapy: Secondary | ICD-10-CM

## 2013-03-05 DIAGNOSIS — E041 Nontoxic single thyroid nodule: Secondary | ICD-10-CM | POA: Diagnosis present

## 2013-03-05 DIAGNOSIS — F17200 Nicotine dependence, unspecified, uncomplicated: Secondary | ICD-10-CM | POA: Diagnosis present

## 2013-03-05 DIAGNOSIS — Z833 Family history of diabetes mellitus: Secondary | ICD-10-CM

## 2013-03-05 DIAGNOSIS — E669 Obesity, unspecified: Secondary | ICD-10-CM | POA: Diagnosis present

## 2013-03-05 DIAGNOSIS — I2510 Atherosclerotic heart disease of native coronary artery without angina pectoris: Secondary | ICD-10-CM | POA: Diagnosis present

## 2013-03-05 DIAGNOSIS — Z880 Allergy status to penicillin: Secondary | ICD-10-CM

## 2013-03-05 DIAGNOSIS — Z9861 Coronary angioplasty status: Secondary | ICD-10-CM

## 2013-03-05 DIAGNOSIS — Z9089 Acquired absence of other organs: Secondary | ICD-10-CM

## 2013-03-05 DIAGNOSIS — Z7982 Long term (current) use of aspirin: Secondary | ICD-10-CM

## 2013-03-05 DIAGNOSIS — S82202A Unspecified fracture of shaft of left tibia, initial encounter for closed fracture: Secondary | ICD-10-CM

## 2013-03-05 DIAGNOSIS — Z8249 Family history of ischemic heart disease and other diseases of the circulatory system: Secondary | ICD-10-CM

## 2013-03-05 DIAGNOSIS — G4733 Obstructive sleep apnea (adult) (pediatric): Secondary | ICD-10-CM | POA: Diagnosis present

## 2013-03-05 DIAGNOSIS — IMO0001 Reserved for inherently not codable concepts without codable children: Secondary | ICD-10-CM

## 2013-03-05 DIAGNOSIS — K219 Gastro-esophageal reflux disease without esophagitis: Secondary | ICD-10-CM | POA: Diagnosis present

## 2013-03-05 DIAGNOSIS — Z6837 Body mass index (BMI) 37.0-37.9, adult: Secondary | ICD-10-CM

## 2013-03-05 DIAGNOSIS — I252 Old myocardial infarction: Secondary | ICD-10-CM

## 2013-03-05 DIAGNOSIS — N529 Male erectile dysfunction, unspecified: Secondary | ICD-10-CM | POA: Diagnosis present

## 2013-03-05 HISTORY — DX: Acute myocardial infarction, unspecified: I21.9

## 2013-03-05 LAB — CBC
HCT: 41.8 % (ref 39.0–52.0)
MCHC: 35.6 g/dL (ref 30.0–36.0)
Platelets: 221 10*3/uL (ref 150–400)
RDW: 13.2 % (ref 11.5–15.5)
WBC: 8.4 10*3/uL (ref 4.0–10.5)

## 2013-03-05 LAB — BASIC METABOLIC PANEL
Chloride: 101 mEq/L (ref 96–112)
GFR calc Af Amer: 70 mL/min — ABNORMAL LOW (ref >90)
GFR calc non Af Amer: 60 mL/min — ABNORMAL LOW (ref >90)
Potassium: 3.3 mEq/L — ABNORMAL LOW (ref 3.5–5.1)
Sodium: 139 mEq/L (ref 135–145)

## 2013-03-05 LAB — POCT I-STAT TROPONIN I: Troponin i, poc: 0 ng/mL (ref 0.00–0.08)

## 2013-03-05 LAB — CREATININE, SERUM: GFR calc non Af Amer: 73 mL/min — ABNORMAL LOW (ref >90)

## 2013-03-05 MED ORDER — IOHEXOL 350 MG/ML SOLN
50.0000 mL | Freq: Once | INTRAVENOUS | Status: AC | PRN
  Administered 2013-03-05: 50 mL via INTRAVENOUS

## 2013-03-05 MED ORDER — POTASSIUM CHLORIDE 20 MEQ PO PACK
20.0000 meq | PACK | Freq: Every day | ORAL | Status: DC
  Filled 2013-03-05: qty 1

## 2013-03-05 MED ORDER — DOCUSATE SODIUM 100 MG PO CAPS
100.0000 mg | ORAL_CAPSULE | Freq: Two times a day (BID) | ORAL | Status: DC
  Administered 2013-03-05 – 2013-03-07 (×5): 100 mg via ORAL
  Filled 2013-03-05 (×6): qty 1

## 2013-03-05 MED ORDER — OXYCODONE HCL 5 MG PO TABS
10.0000 mg | ORAL_TABLET | ORAL | Status: DC | PRN
  Administered 2013-03-05 – 2013-03-06 (×3): 10 mg via ORAL
  Filled 2013-03-05 (×3): qty 2

## 2013-03-05 MED ORDER — CARVEDILOL 12.5 MG PO TABS
12.5000 mg | ORAL_TABLET | Freq: Two times a day (BID) | ORAL | Status: DC
  Administered 2013-03-05 – 2013-03-07 (×4): 12.5 mg via ORAL
  Filled 2013-03-05 (×6): qty 1

## 2013-03-05 MED ORDER — AMLODIPINE BESYLATE 10 MG PO TABS
10.0000 mg | ORAL_TABLET | Freq: Every day | ORAL | Status: DC
  Administered 2013-03-05 – 2013-03-07 (×3): 10 mg via ORAL
  Filled 2013-03-05 (×3): qty 1

## 2013-03-05 MED ORDER — MORPHINE SULFATE 2 MG/ML IJ SOLN
2.0000 mg | INTRAMUSCULAR | Status: DC | PRN
  Administered 2013-03-05 – 2013-03-06 (×3): 2 mg via INTRAVENOUS
  Filled 2013-03-05 (×3): qty 1

## 2013-03-05 MED ORDER — NITROGLYCERIN 0.4 MG SL SUBL: 0.4000 mg | SUBLINGUAL_TABLET | SUBLINGUAL | Status: DC | PRN

## 2013-03-05 MED ORDER — ONDANSETRON HCL 4 MG PO TABS: 4.0000 mg | ORAL_TABLET | Freq: Four times a day (QID) | ORAL | Status: DC | PRN

## 2013-03-05 MED ORDER — POTASSIUM CHLORIDE CRYS ER 20 MEQ PO TBCR
20.0000 meq | EXTENDED_RELEASE_TABLET | Freq: Every day | ORAL | Status: DC
  Administered 2013-03-05 – 2013-03-06 (×2): 20 meq via ORAL
  Filled 2013-03-05 (×3): qty 1

## 2013-03-05 MED ORDER — PANTOPRAZOLE SODIUM 40 MG PO TBEC
40.0000 mg | DELAYED_RELEASE_TABLET | Freq: Every day | ORAL | Status: DC
  Administered 2013-03-05 – 2013-03-07 (×3): 40 mg via ORAL
  Filled 2013-03-05 (×3): qty 1

## 2013-03-05 MED ORDER — LISINOPRIL 10 MG PO TABS
10.0000 mg | ORAL_TABLET | Freq: Every day | ORAL | Status: DC
  Administered 2013-03-05 – 2013-03-07 (×3): 10 mg via ORAL
  Filled 2013-03-05 (×3): qty 1

## 2013-03-05 MED ORDER — HYDROMORPHONE HCL PF 1 MG/ML IJ SOLN
1.0000 mg | Freq: Once | INTRAMUSCULAR | Status: AC
  Administered 2013-03-05: 1 mg via INTRAVENOUS
  Filled 2013-03-05: qty 1

## 2013-03-05 MED ORDER — SODIUM CHLORIDE 0.9 % IV BOLUS (SEPSIS)
1000.0000 mL | Freq: Once | INTRAVENOUS | Status: AC
  Administered 2013-03-05: 1000 mL via INTRAVENOUS

## 2013-03-05 MED ORDER — KCL IN DEXTROSE-NACL 20-5-0.45 MEQ/L-%-% IV SOLN
INTRAVENOUS | Status: DC
  Administered 2013-03-05: 16:00:00 via INTRAVENOUS
  Filled 2013-03-05 (×4): qty 1000

## 2013-03-05 MED ORDER — HYDROCHLOROTHIAZIDE 12.5 MG PO CAPS
12.5000 mg | ORAL_CAPSULE | Freq: Every day | ORAL | Status: DC
  Administered 2013-03-05: 12.5 mg via ORAL
  Filled 2013-03-05: qty 1

## 2013-03-05 MED ORDER — MORPHINE SULFATE 4 MG/ML IJ SOLN
4.0000 mg | INTRAMUSCULAR | Status: DC | PRN
  Administered 2013-03-05: 4 mg via INTRAVENOUS
  Filled 2013-03-05: qty 1

## 2013-03-05 MED ORDER — ONDANSETRON HCL 4 MG/2ML IJ SOLN: 4.0000 mg | Freq: Four times a day (QID) | INTRAMUSCULAR | Status: DC | PRN

## 2013-03-05 MED ORDER — IOHEXOL 300 MG/ML  SOLN
100.0000 mL | Freq: Once | INTRAMUSCULAR | Status: AC | PRN
  Administered 2013-03-05: 100 mL via INTRAVENOUS

## 2013-03-05 MED ORDER — MORPHINE SULFATE 4 MG/ML IJ SOLN
4.0000 mg | Freq: Once | INTRAMUSCULAR | Status: AC
  Administered 2013-03-05: 4 mg via INTRAVENOUS
  Filled 2013-03-05: qty 1

## 2013-03-05 MED ORDER — ACETAMINOPHEN 325 MG PO TABS: 650.0000 mg | ORAL_TABLET | ORAL | Status: DC | PRN

## 2013-03-05 MED ORDER — OXYCODONE HCL 5 MG PO TABS: 5.0000 mg | ORAL_TABLET | ORAL | Status: DC | PRN

## 2013-03-05 MED ORDER — PANTOPRAZOLE SODIUM 40 MG IV SOLR
40.0000 mg | Freq: Every day | INTRAVENOUS | Status: DC
  Filled 2013-03-05 (×2): qty 40
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
## 2013-03-05 NOTE — ED Notes (Addendum)
Tresa Endo, Trauma PA at bedside.  This RN assumed care this AM.  Upon reviewing chart, there is no documentation that spine was cleared by EDP or PA.  This RN and Aundra Millet, RN assisted Trauma PA to clear spine.
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
## 2013-03-05 NOTE — ED Provider Notes (Signed)
Medical screening examination/treatment/procedure(s) were conducted as a shared visit with non-physician practitioner(s) and myself.  I personally evaluated the patient during the encounter   Brandt Loosen, MD 03/05/13 [phone]
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
## 2013-03-05 NOTE — ED Notes (Signed)
Attempted to call report x 2.  RN is tied up with Dr Ezzard Standing.

## 2013-03-05 NOTE — H&P (Signed)
Agree with above 
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
## 2013-03-05 NOTE — ED Provider Notes (Signed)
Medical screening examination/treatment/procedure(s) were conducted as a shared visit with non-physician practitioner(s) and myself.  I personally evaluated the patient during the encounter  Please note that this a late entry. The patient was found to have a sternal fracture with associated hematoma on contrasted chest CT. In addition, he has a fracture of the tibia tubercle and persistent neck pain. We have consulted, surgery service and the patient will be admitted for further evaluation and treatment., Surgery service we'll consult orthopedics regarding the patient's tibia fracture.   Brandt Loosen, MD 03/05/13 2252

## 2013-03-06 ENCOUNTER — Inpatient Hospital Stay (HOSPITAL_COMMUNITY): Payer: BC Managed Care – PPO

## 2013-03-06 LAB — BASIC METABOLIC PANEL
BUN: 10 mg/dL (ref 6–23)
CO2: 28 mEq/L (ref 19–32)
Calcium: 8.7 mg/dL (ref 8.4–10.5)
GFR calc non Af Amer: 70 mL/min — ABNORMAL LOW (ref >90)
Glucose, Bld: 109 mg/dL — ABNORMAL HIGH (ref 70–99)
Sodium: 138 mEq/L (ref 135–145)

## 2013-03-06 LAB — CBC
HCT: 40.7 % (ref 39.0–52.0)
Hemoglobin: 13.9 g/dL (ref 13.0–17.0)
MCH: 28.5 pg (ref 26.0–34.0)
MCHC: 34.2 g/dL (ref 30.0–36.0)
RBC: 4.88 MIL/uL (ref 4.22–5.81)

## 2013-03-06 MED ORDER — METHOCARBAMOL 750 MG PO TABS
1500.0000 mg | ORAL_TABLET | Freq: Four times a day (QID) | ORAL | Status: DC
  Administered 2013-03-06 – 2013-03-07 (×5): 1500 mg via ORAL
  Filled 2013-03-06 (×8): qty 2

## 2013-03-06 MED ORDER — ENOXAPARIN SODIUM 30 MG/0.3ML ~~LOC~~ SOLN
30.0000 mg | Freq: Two times a day (BID) | SUBCUTANEOUS | Status: DC
  Administered 2013-03-06 – 2013-03-07 (×3): 30 mg via SUBCUTANEOUS
  Filled 2013-03-06 (×4): qty 0.3

## 2013-03-06 MED ORDER — OXYCODONE HCL 5 MG PO TABS
10.0000 mg | ORAL_TABLET | ORAL | Status: DC | PRN
  Administered 2013-03-06 (×2): 15 mg via ORAL
  Administered 2013-03-06 – 2013-03-07 (×3): 10 mg via ORAL
  Filled 2013-03-06: qty 15
  Filled 2013-03-06: qty 2
  Filled 2013-03-06: qty 3
  Filled 2013-03-06 (×2): qty 2

## 2013-03-06 MED ORDER — MORPHINE SULFATE 2 MG/ML IJ SOLN
2.0000 mg | INTRAMUSCULAR | Status: DC | PRN
  Administered 2013-03-06 (×2): 2 mg via INTRAVENOUS
  Filled 2013-03-06 (×2): qty 1

## 2013-03-06 MED ORDER — NAPROXEN 500 MG PO TABS
500.0000 mg | ORAL_TABLET | Freq: Two times a day (BID) | ORAL | Status: DC
  Filled 2013-03-06 (×3): qty 1

## 2013-03-06 MED ORDER — ACETAMINOPHEN 500 MG PO TABS: 1000.0000 mg | ORAL_TABLET | Freq: Four times a day (QID) | ORAL | Status: DC | PRN

## 2013-03-06 NOTE — Progress Notes (Signed)
Should be able to go home later today.  Will DC NSAIDs  This patient has been seen and I agree with the findings and treatment plan.  Marta Lamas. Gae Bon, MD, FACS [phone] (pager) [phone] (direct pager) Trauma Surgeon
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
## 2013-03-06 NOTE — ED Provider Notes (Signed)
Medical screening examination/treatment/procedure(s) were conducted as a shared visit with non-physician practitioner(s) and myself.  I personally evaluated the patient during the encounter   Brandt Loosen, MD 03/06/13 2258

## 2013-03-06 NOTE — Progress Notes (Signed)
UR completed 

## 2013-03-07 ENCOUNTER — Encounter (INDEPENDENT_AMBULATORY_CARE_PROVIDER_SITE_OTHER): Payer: Self-pay | Admitting: Orthopedic Surgery

## 2013-03-07 DIAGNOSIS — E041 Nontoxic single thyroid nodule: Secondary | ICD-10-CM | POA: Insufficient documentation

## 2013-03-07 DIAGNOSIS — E010 Iodine-deficiency related diffuse (endemic) goiter: Secondary | ICD-10-CM | POA: Insufficient documentation

## 2013-03-07 MED ORDER — OXYCODONE-ACETAMINOPHEN 10-325 MG PO TABS: 0.5000 | ORAL_TABLET | ORAL | Status: DC | PRN

## 2013-03-07 MED ORDER — METHOCARBAMOL 500 MG PO TABS: 500.0000 mg | ORAL_TABLET | Freq: Four times a day (QID) | ORAL | Status: DC | PRN

## 2013-03-07 MED ORDER — TRAMADOL HCL 50 MG PO TABS: 100.0000 mg | ORAL_TABLET | Freq: Four times a day (QID) | ORAL | Status: AC
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
## 2013-03-07 NOTE — Progress Notes (Signed)
Pt. Has already placed himself on CPAP & is tolerating it well at this time.

## 2013-03-20 ENCOUNTER — Telehealth (HOSPITAL_COMMUNITY): Payer: Self-pay | Admitting: Emergency Medicine

## 2013-03-21 ENCOUNTER — Telehealth (HOSPITAL_COMMUNITY): Payer: Self-pay | Admitting: Emergency Medicine

## 2013-03-21 NOTE — Telephone Encounter (Signed)
Talked to patient and offered him a prescription for Ultram, but he denied it because he has some to make it to his appointment on Friday with Ortho.  Told him that I can not call in a rx for Percocet anymore due to the new laws.

## 2013-03-23 ENCOUNTER — Ambulatory Visit (INDEPENDENT_AMBULATORY_CARE_PROVIDER_SITE_OTHER): Payer: BC Managed Care – PPO | Admitting: Physician Assistant

## 2013-03-23 ENCOUNTER — Encounter: Payer: Self-pay | Admitting: Physician Assistant

## 2013-03-23 VITALS — BP 130/87 | HR 66 | Ht 72.0 in | Wt 276.0 lb

## 2013-03-23 DIAGNOSIS — E785 Hyperlipidemia, unspecified: Secondary | ICD-10-CM

## 2013-03-23 DIAGNOSIS — R079 Chest pain, unspecified: Secondary | ICD-10-CM

## 2013-03-23 DIAGNOSIS — I2510 Atherosclerotic heart disease of native coronary artery without angina pectoris: Secondary | ICD-10-CM

## 2013-03-23 DIAGNOSIS — I10 Essential (primary) hypertension: Secondary | ICD-10-CM

## 2013-03-23 NOTE — Patient Instructions (Addendum)
Your physician has requested that you have a lexiscan myoview. DX 786.50.For further information please visit [email or web address] Please follow instruction sheet, as given.  YOU WILL NEED FASTING LIPID AND LIVER PANEL TO BE DONE THE SAME DAY AS STRESS TEST
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
## 2013-03-24 ENCOUNTER — Other Ambulatory Visit: Payer: Self-pay | Admitting: Cardiology

## 2013-04-04 ENCOUNTER — Ambulatory Visit (HOSPITAL_COMMUNITY): Payer: BC Managed Care – PPO | Attending: Cardiology | Admitting: Radiology

## 2013-04-04 ENCOUNTER — Other Ambulatory Visit (INDEPENDENT_AMBULATORY_CARE_PROVIDER_SITE_OTHER): Payer: BC Managed Care – PPO

## 2013-04-04 VITALS — BP 136/98 | HR 63 | Ht 72.0 in | Wt 276.0 lb

## 2013-04-04 DIAGNOSIS — Z87891 Personal history of nicotine dependence: Secondary | ICD-10-CM | POA: Insufficient documentation

## 2013-04-04 DIAGNOSIS — I252 Old myocardial infarction: Secondary | ICD-10-CM | POA: Insufficient documentation

## 2013-04-04 DIAGNOSIS — I2510 Atherosclerotic heart disease of native coronary artery without angina pectoris: Secondary | ICD-10-CM

## 2013-04-04 DIAGNOSIS — R079 Chest pain, unspecified: Secondary | ICD-10-CM

## 2013-04-04 DIAGNOSIS — R0989 Other specified symptoms and signs involving the circulatory and respiratory systems: Secondary | ICD-10-CM | POA: Insufficient documentation

## 2013-04-04 DIAGNOSIS — R0789 Other chest pain: Secondary | ICD-10-CM | POA: Insufficient documentation

## 2013-04-04 DIAGNOSIS — R0602 Shortness of breath: Secondary | ICD-10-CM | POA: Insufficient documentation

## 2013-04-04 DIAGNOSIS — R0609 Other forms of dyspnea: Secondary | ICD-10-CM | POA: Insufficient documentation

## 2013-04-04 DIAGNOSIS — R209 Unspecified disturbances of skin sensation: Secondary | ICD-10-CM | POA: Insufficient documentation

## 2013-04-04 DIAGNOSIS — E785 Hyperlipidemia, unspecified: Secondary | ICD-10-CM | POA: Insufficient documentation

## 2013-04-04 DIAGNOSIS — R5381 Other malaise: Secondary | ICD-10-CM | POA: Insufficient documentation

## 2013-04-04 DIAGNOSIS — I10 Essential (primary) hypertension: Secondary | ICD-10-CM | POA: Insufficient documentation

## 2013-04-04 LAB — HEPATIC FUNCTION PANEL
AST: 31 U/L (ref 0–37)
Albumin: 4.4 g/dL (ref 3.5–5.2)
Alkaline Phosphatase: 59 U/L (ref 39–117)
Total Bilirubin: 0.6 mg/dL (ref 0.3–1.2)

## 2013-04-04 LAB — LIPID PANEL
HDL: 55.7 mg/dL (ref >39.00)
LDL Cholesterol: 92 mg/dL (ref 0–99)
Total CHOL/HDL Ratio: 3
Triglycerides: 185 mg/dL — ABNORMAL HIGH (ref 0.0–149.0)

## 2013-04-04 MED ORDER — REGADENOSON 0.4 MG/5ML IV SOLN
0.4000 mg | Freq: Once | INTRAVENOUS | Status: AC
  Administered 2013-04-04: 0.4 mg via INTRAVENOUS

## 2013-04-04 MED ORDER — TECHNETIUM TC 99M SESTAMIBI GENERIC - CARDIOLITE
11.0000 | Freq: Once | INTRAVENOUS | Status: AC | PRN
  Administered 2013-04-04: 11 via INTRAVENOUS

## 2013-04-04 MED ORDER — TECHNETIUM TC 99M SESTAMIBI GENERIC - CARDIOLITE
33.0000 | Freq: Once | INTRAVENOUS | Status: AC | PRN
  Administered 2013-04-04: 33 via INTRAVENOUS

## 2013-04-04 NOTE — Progress Notes (Signed)
  [facility] SITE 3 NUCLEAR MED [address] [place], [place] [address] [phone]    Cardiology Nuclear Med Study  [name] is a 47 y.o. male     MRN : [id number]     DOB: 01-Apr-1966  Procedure Date: 04/04/2013  Nuclear Med Background Indication for Stress Test:  Evaluation for Ischemia and Stent Patency History:  '11 Heart Catheterization> Stent of PDA;Echo;EF=60%;'11 and '12 NSTEMI;'12 MPS:EF=61% Cardiac Risk Factors: History of Smoking, Hypertension and Lipids  Symptoms:  Chest Pain (chest discomfort everyday), DOE, Fatigue, SOB and complains of numbness in left arm at times   Nuclear Pre-Procedure Caffeine/Decaff Intake:  None NPO After: 7:30pm    O2 Sat: 98% on room air. IV 0.9% NS with Angio Cath:  22g  IV Site: R Antecubital  IV Started by:  Rickard Patience  Chest Size (in):  48 Cup Size: n/a  Height: 6' (1.829 m)  Weight:  276 lb (125.193 kg)  BMI:  Body mass index is 37.42 kg/(m^2). Tech Comments:  No Coreg x 12 hrs    Nuclear Med Study 1 or 2 day study: 1 day  Stress Test Type:  Lexiscan  Reading MD: Cassell Clement, MD  Order Authorizing Provider:  Fransico Meadow  Resting Radionuclide: Technetium 49m Sestamibi  Resting Radionuclide Dose: 11.0 mCi   Stress Radionuclide:  Technetium 51m Sestamibi  Stress Radionuclide Dose: 33.0 mCi           Stress Protocol Rest HR: 63 Stress HR: 80  Rest BP: 136/98 Stress BP: 140/86  Exercise Time (min): n/a METS: n/a   Predicted Max HR: 173 bpm % Max HR: 46.24 bpm Rate Pressure Product: 21308   Dose of Adenosine (mg):  n/a Dose of Lexiscan: 0.4 mg  Dose of Atropine (mg): n/a Dose of Dobutamine: n/a mcg/kg/min (at max HR)  Stress Test Technologist: Nelson Chimes, BS-ES  Nuclear Technologist:  Domenic Polite, CNMT     Rest Procedure:  Myocardial perfusion imaging was performed at rest 45 minutes following the intravenous administration of Technetium 23m Sestamibi.    Rest ECG: NSR - Normal  EKG  Stress Procedure:  The patient received IV Lexiscan 0.4 mg over 15-seconds.  Technetium 69m Sestamibi injected at 30-seconds.  Patient c/o chest tightness, lightheadedness and upset stomach.  Quantitative spect images were obtained after a 45 minute delay.  Stress ECG: No significant change from baseline ECG  QPS Raw Data Images:  Normal; no motion artifact; normal heart/lung ratio. Stress Images:  Normal homogeneous uptake in all areas of the myocardium. Rest Images:  Normal homogeneous uptake in all areas of the myocardium. Subtraction (SDS):  No evidence of ischemia. Transient Ischemic Dilatation (Normal <1.22):  N/A  Lung/Heart Ratio (Normal <0.45):  0.33  Quantitative Gated Spect Images QGS EDV:  155 ml QGS ESV:  81 ml  Impression Exercise Capacity:  Lexiscan with no exercise. BP Response:  Normal blood pressure response. Clinical Symptoms:  Slight chest tightness. Headache. Nausea. ECG Impression:  No significant ST segment change suggestive of ischemia. Comparison with Prior Nuclear Study: No significant change from previous study except decrease in EF from 61% to 48%.  Overall Impression:  Low risk stress nuclear study.  Perfusion imaging shows no ischemia or infarction. Mild depression of LV systolic function.  LV Ejection Fraction: 48%.  LV Wall Motion:  Normal Wall Motion  Cassell Clement

## 2013-04-05 ENCOUNTER — Telehealth: Payer: Self-pay | Admitting: *Deleted

## 2013-04-05 ENCOUNTER — Ambulatory Visit (INDEPENDENT_AMBULATORY_CARE_PROVIDER_SITE_OTHER): Payer: BC Managed Care – PPO | Admitting: Cardiology

## 2013-04-05 ENCOUNTER — Encounter: Payer: Self-pay | Admitting: Physician Assistant

## 2013-04-05 VITALS — BP 120/83 | HR 69 | Ht 72.0 in | Wt 276.0 lb

## 2013-04-05 DIAGNOSIS — E785 Hyperlipidemia, unspecified: Secondary | ICD-10-CM

## 2013-04-05 DIAGNOSIS — I10 Essential (primary) hypertension: Secondary | ICD-10-CM

## 2013-04-05 DIAGNOSIS — I2510 Atherosclerotic heart disease of native coronary artery without angina pectoris: Secondary | ICD-10-CM

## 2013-04-05 NOTE — Telephone Encounter (Signed)
pt notified about myoview and lab results with the recommendation to watch diet and try to increase exercise amke sure to take lipitor 80 qd. Echo 04/06/13 to re-assess EF 48 %.[name]

## 2013-04-05 NOTE — Patient Instructions (Signed)
Your physician wants you to follow-up in: 6 months with Dr McLean. (April 2015). You will receive a reminder letter in the mail two months in advance. If you don't receive a letter, please call our office to schedule the follow-up appointment.  

## 2013-04-06 ENCOUNTER — Other Ambulatory Visit (HOSPITAL_COMMUNITY): Payer: BC Managed Care – PPO

## 2013-04-06 ENCOUNTER — Telehealth: Payer: Self-pay | Admitting: Cardiology

## 2013-04-06 NOTE — Telephone Encounter (Signed)
returned call      Pt called back said returning our call.
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
## 2013-04-06 NOTE — Telephone Encounter (Signed)
I did not call patient, he thinks it may have been a reminder call for echo scheduled 04/11/13.

## 2013-04-10 ENCOUNTER — Telehealth (HOSPITAL_COMMUNITY): Payer: Self-pay | Admitting: Emergency Medicine

## 2013-04-11 ENCOUNTER — Telehealth (HOSPITAL_COMMUNITY): Payer: Self-pay | Admitting: Emergency Medicine

## 2013-04-11 ENCOUNTER — Ambulatory Visit (HOSPITAL_COMMUNITY): Payer: BC Managed Care – PPO | Attending: Physician Assistant | Admitting: Cardiology

## 2013-04-11 DIAGNOSIS — I2510 Atherosclerotic heart disease of native coronary artery without angina pectoris: Secondary | ICD-10-CM | POA: Insufficient documentation

## 2013-04-11 DIAGNOSIS — I079 Rheumatic tricuspid valve disease, unspecified: Secondary | ICD-10-CM | POA: Insufficient documentation

## 2013-04-11 DIAGNOSIS — E785 Hyperlipidemia, unspecified: Secondary | ICD-10-CM | POA: Insufficient documentation

## 2013-04-11 DIAGNOSIS — I10 Essential (primary) hypertension: Secondary | ICD-10-CM | POA: Insufficient documentation

## 2013-04-11 NOTE — Telephone Encounter (Signed)
No answer

## 2013-04-11 NOTE — Progress Notes (Signed)
Echo performed. 

## 2013-04-12 ENCOUNTER — Encounter: Payer: Self-pay | Admitting: Physician Assistant

## 2013-04-12 NOTE — Telephone Encounter (Signed)
No answer

## 2013-04-17 ENCOUNTER — Other Ambulatory Visit: Payer: Self-pay | Admitting: Cardiology

## 2013-04-19 ENCOUNTER — Other Ambulatory Visit: Payer: Self-pay | Admitting: Cardiology

## 2013-05-18 ENCOUNTER — Other Ambulatory Visit: Payer: Self-pay | Admitting: Endocrinology

## 2013-05-18 DIAGNOSIS — E041 Nontoxic single thyroid nodule: Secondary | ICD-10-CM

## 2013-05-22 ENCOUNTER — Telehealth: Payer: Self-pay | Admitting: Cardiology

## 2013-05-22 NOTE — Telephone Encounter (Signed)
New problem    Need to know can pt come off his blood thinner for a biopsy. Also need to know what time of blood thinner pt takes so she can give instructions.

## 2013-05-22 NOTE — Telephone Encounter (Signed)
Spoke with Tammy-she is aware only blood thinner listed is aspirin 81mg  daily. She states he does not need to hold aspirin for biopsy.

## 2013-05-24 ENCOUNTER — Ambulatory Visit
Admission: RE | Admit: 2013-05-24 | Discharge: 2013-05-24 | Disposition: A | Discharge status: Home / Self Care | Payer: BC Managed Care – PPO | Source: Ambulatory Visit | Attending: Endocrinology | Admitting: Endocrinology

## 2013-05-24 ENCOUNTER — Other Ambulatory Visit: Payer: Self-pay | Admitting: Endocrinology

## 2013-05-24 DIAGNOSIS — E041 Nontoxic single thyroid nodule: Secondary | ICD-10-CM

## 2013-05-30 ENCOUNTER — Other Ambulatory Visit: Payer: BC Managed Care – PPO

## 2013-11-01 ENCOUNTER — Emergency Department (INDEPENDENT_AMBULATORY_CARE_PROVIDER_SITE_OTHER)
Admission: EM | Admit: 2013-11-01 | Discharge: 2013-11-01 | Disposition: A | Discharge status: Other Acute Inpatient Hospital | Payer: Self-pay | Source: Home / Self Care | Attending: Family Medicine | Admitting: Family Medicine

## 2013-11-01 ENCOUNTER — Encounter (HOSPITAL_COMMUNITY): Payer: Self-pay | Admitting: Emergency Medicine

## 2013-11-01 ENCOUNTER — Emergency Department (HOSPITAL_COMMUNITY): Payer: Self-pay

## 2013-11-01 DIAGNOSIS — Z9089 Acquired absence of other organs: Secondary | ICD-10-CM | POA: Insufficient documentation

## 2013-11-01 DIAGNOSIS — K219 Gastro-esophageal reflux disease without esophagitis: Secondary | ICD-10-CM | POA: Insufficient documentation

## 2013-11-01 DIAGNOSIS — Z7982 Long term (current) use of aspirin: Secondary | ICD-10-CM | POA: Insufficient documentation

## 2013-11-01 DIAGNOSIS — I10 Essential (primary) hypertension: Secondary | ICD-10-CM | POA: Insufficient documentation

## 2013-11-01 DIAGNOSIS — L03119 Cellulitis of unspecified part of limb: Secondary | ICD-10-CM

## 2013-11-01 DIAGNOSIS — E785 Hyperlipidemia, unspecified: Secondary | ICD-10-CM | POA: Insufficient documentation

## 2013-11-01 DIAGNOSIS — Z9861 Coronary angioplasty status: Secondary | ICD-10-CM | POA: Insufficient documentation

## 2013-11-01 DIAGNOSIS — L02419 Cutaneous abscess of limb, unspecified: Secondary | ICD-10-CM | POA: Insufficient documentation

## 2013-11-01 DIAGNOSIS — I2510 Atherosclerotic heart disease of native coronary artery without angina pectoris: Secondary | ICD-10-CM | POA: Insufficient documentation

## 2013-11-01 DIAGNOSIS — Z87891 Personal history of nicotine dependence: Secondary | ICD-10-CM | POA: Insufficient documentation

## 2013-11-01 DIAGNOSIS — L03115 Cellulitis of right lower limb: Secondary | ICD-10-CM

## 2013-11-01 DIAGNOSIS — Z880 Allergy status to penicillin: Secondary | ICD-10-CM | POA: Insufficient documentation

## 2013-11-01 DIAGNOSIS — Z79899 Other long term (current) drug therapy: Secondary | ICD-10-CM | POA: Insufficient documentation

## 2013-11-01 DIAGNOSIS — G4733 Obstructive sleep apnea (adult) (pediatric): Secondary | ICD-10-CM | POA: Insufficient documentation

## 2013-11-01 DIAGNOSIS — I252 Old myocardial infarction: Secondary | ICD-10-CM | POA: Insufficient documentation

## 2013-11-01 LAB — I-STAT CHEM 8, ED
BUN: 13 mg/dL (ref 6–23)
CALCIUM ION: 1.17 mmol/L (ref 1.12–1.23)
CHLORIDE: 102 meq/L (ref 96–112)
Creatinine, Ser: 1.4 mg/dL — ABNORMAL HIGH (ref 0.50–1.35)
GLUCOSE: 104 mg/dL — AB (ref 70–99)
HCT: 41 % (ref 39.0–52.0)
Hemoglobin: 13.9 g/dL (ref 13.0–17.0)
Potassium: 3.4 mEq/L — ABNORMAL LOW (ref 3.7–5.3)
Sodium: 144 mEq/L (ref 137–147)
TCO2: 25 mmol/L (ref 0–100)

## 2013-11-01 LAB — CBC WITH DIFFERENTIAL/PLATELET
Basophils Absolute: 0 10*3/uL (ref 0.0–0.1)
Basophils Relative: 0 % (ref 0–1)
EOS PCT: 2 % (ref 0–5)
Eosinophils Absolute: 0.2 10*3/uL (ref 0.0–0.7)
HEMATOCRIT: 39.6 % (ref 39.0–52.0)
HEMOGLOBIN: 13.5 g/dL (ref 13.0–17.0)
Lymphocytes Relative: 37 % (ref 12–46)
Lymphs Abs: 3.2 10*3/uL (ref 0.7–4.0)
MCH: 28.7 pg (ref 26.0–34.0)
MCHC: 34.1 g/dL (ref 30.0–36.0)
MCV: 84.1 fL (ref 78.0–100.0)
MONO ABS: 0.9 10*3/uL (ref 0.1–1.0)
MONOS PCT: 11 % (ref 3–12)
NEUTROS ABS: 4.3 10*3/uL (ref 1.7–7.7)
Neutrophils Relative %: 50 % (ref 43–77)
Platelets: 247 10*3/uL (ref 150–400)
RBC: 4.71 MIL/uL (ref 4.22–5.81)
RDW: 13.4 % (ref 11.5–15.5)
WBC: 8.6 10*3/uL (ref 4.0–10.5)

## 2013-11-01 MED ORDER — OXYCODONE-ACETAMINOPHEN 5-325 MG PO TABS
2.0000 | ORAL_TABLET | Freq: Once | ORAL | Status: AC
  Administered 2013-11-01: 2 via ORAL
  Filled 2013-11-01: qty 2

## 2013-11-01 NOTE — ED Notes (Signed)
Pain right knee x couple of days. 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 Hot to touch, tight, red

## 2013-11-01 NOTE — ED Notes (Addendum)
[facility] is [facility].  [facility], there is some swelling and redness to the knee.  Patient states that he broke his knee last year and just getting back to work.  He states that it feels like there is a splinter in his knee.  Patient does have a wound on the knee, area is painful to touch, red and warm to touch.

## 2013-11-01 NOTE — ED Provider Notes (Signed)
CSN: [id number]     Arrival date & time 11/01/13  2008 History   First MD Initiated Contact with Patient 11/01/13 2122     Chief Complaint  Patient presents with  . Knee Pain   (Consider location/radiation/quality/duration/timing/severity/associated sxs/prior Treatment) Patient is a 48 y.o. male presenting with knee pain. The history is provided by the patient.  Knee Pain Location:  Knee Time since incident:  2 days Injury: no   Knee location:  R knee Pain details:    Quality:  Burning and sharp   Radiates to:  Does not radiate   Severity:  Moderate   Onset quality:  Sudden   Progression:  Worsening Chronicity:  New Dislocation: no   Relieved by:  None tried Worsened by:  Nothing tried Ineffective treatments:  None tried Associated symptoms: decreased ROM and swelling   Associated symptoms: no back pain and no fever   Risk factors comment:  Poss. foreign body from work with wood.   Past Medical History  Diagnosis Date  . Hypertension   . Hyperlipidemia   . GERD (gastroesophageal reflux disease)   . Coronary artery disease     NSTEMI 10/11 LHC showed 99% OM2, EF 65%. He had 2.25 X 15 promus DES to PDA. Echo (11/11) showed EF 60% mild to moderate LVH, moderate diastolic dysfunction;  ETT-myoview (10/12): 9' exercise, no ECG changes, EF 61%, normal perfusion images.   Lexiscan Myoview (10/14):  Low risk, no ischemia, EF 48%  . Ex-smoker   . S/P appendectomy   . OSA (obstructive sleep apnea)     CPAP  . ED (erectile dysfunction)   . MI (myocardial infarction) 2011  . Hx of echocardiogram     Echo (10/14):  Mod LVH, EF 55-60%   Past Surgical History  Procedure Laterality Date  . Appendectomy    . Coronary stent placement      x1   Family History  Problem Relation Age of Onset  . Coronary artery disease      grandfather  . Diabetes     History  Substance Use Topics  . Smoking status: Former Games developermoker  . Smokeless tobacco: Not on file  . Alcohol Use: Yes   Comment: occ    Review of Systems  Constitutional: Negative.  Negative for fever and chills.  Musculoskeletal: Positive for gait problem and joint swelling. Negative for back pain.  Skin: Positive for rash.    Allergies  Penicillins  Home Medications   Prior to Admission medications   Medication Sig Start Date End Date Taking? Authorizing Provider  amLODipine (NORVASC) 10 MG tablet Take 10 mg by mouth daily.    Historical Provider, MD  amLODipine (NORVASC) 10 MG tablet TAKE ONE TABLET BY MOUTH ONCE DAILY 04/17/13   Laurey Moralealton S McLean, MD  amLODipine (NORVASC) 10 MG tablet TAKE ONE TABLET BY MOUTH ONCE DAILY 04/19/13   Laurey Moralealton S McLean, MD  aspirin EC 81 MG tablet Take 81 mg by mouth every other day.    Historical Provider, MD  atorvastatin (LIPITOR) 80 MG tablet Take 80 mg by mouth at bedtime.    Historical Provider, MD  carvedilol (COREG) 12.5 MG tablet Take 12.5 mg by mouth 2 (two) times daily with a meal.    Historical Provider, MD  hydrochlorothiazide (MICROZIDE) 12.5 MG capsule Take 12.5 mg by mouth daily.    Historical Provider, MD  hydrochlorothiazide (MICROZIDE) 12.5 MG capsule TAKE ONE CAPSULE BY MOUTH ONCE DAILY 04/17/13   Laurey Moralealton S McLean, MD  hydrochlorothiazide (MICROZIDE) 12.5 MG capsule TAKE ONE CAPSULE BY MOUTH ONCE DAILY 04/19/13   Laurey Moralealton S McLean, MD  lisinopril (PRINIVIL,ZESTRIL) 10 MG tablet Take 10 mg by mouth daily.    Historical Provider, MD  lisinopril (PRINIVIL,ZESTRIL) 10 MG tablet TAKE ONE TABLET BY MOUTH ONCE DAILY 04/17/13   Laurey Moralealton S McLean, MD  lisinopril (PRINIVIL,ZESTRIL) 10 MG tablet TAKE ONE TABLET BY MOUTH ONCE DAILY 04/19/13   Laurey Moralealton S McLean, MD  methocarbamol (ROBAXIN) 500 MG tablet Take 1-2 tablets (500-1,000 mg total) by mouth 4 (four) times daily as needed (Muscle spasm). 03/07/13   Freeman CaldronMichael J. Kaian, PA-C  nitroGLYCERIN (NITROSTAT) 0.4 MG SL tablet Place 0.4 mg under the tongue every 5 (five) minutes as needed for chest pain.    Historical Provider, MD   omeprazole (PRILOSEC) 20 MG capsule Take 20 mg by mouth 2 (two) times daily.    Historical Provider, MD  oxyCODONE-acetaminophen (PERCOCET) 10-325 MG per tablet Take 0.5-2 tablets by mouth every 4 (four) hours as needed for pain. 03/07/13 03/07/14  Freeman CaldronMichael J. Calden, PA-C  potassium chloride (KLOR-CON) 20 MEQ packet Take 20 mEq by mouth at bedtime.    Historical Provider, MD  traMADol (ULTRAM) 50 MG tablet Take 2 tablets (100 mg total) by mouth 4 (four) times daily. 03/07/13   Freeman CaldronMichael J. Shahzaib, PA-C   BP 148/78  Pulse 67  Temp(Src) 98.1 F (36.7 C) (Oral)  Resp 20  SpO2 96% Physical Exam  Nursing note and vitals reviewed. Constitutional: He is oriented to person, place, and time. He appears well-developed and well-nourished.  Musculoskeletal: He exhibits tenderness.  Neurological: He is alert and oriented to person, place, and time.  Skin: Skin is warm and dry.  Peripatellar cellulitis right knee, with pustular skin lesions and extreme pain to light palp.. Warmth and erythema.    ED Course  Procedures (including critical care time) Labs Review Labs Reviewed - No data to display  Imaging Review No results found.   MDM   1. Cellulitis of knee, right   sent for I+D, culture and iv abx for right knee.--[facility].    Linna HoffJames D Ray Glacken, MD 11/01/13 2141

## 2013-11-02 ENCOUNTER — Emergency Department (HOSPITAL_COMMUNITY)
Admission: EM | Admit: 2013-11-02 | Discharge: 2013-11-02 | Disposition: A | Discharge status: Home / Self Care | Payer: Self-pay | Attending: Emergency Medicine | Admitting: Emergency Medicine

## 2013-11-02 DIAGNOSIS — L02415 Cutaneous abscess of right lower limb: Secondary | ICD-10-CM

## 2013-11-02 DIAGNOSIS — L03115 Cellulitis of right lower limb: Secondary | ICD-10-CM

## 2013-11-02 MED ORDER — SULFAMETHOXAZOLE-TMP DS 800-160 MG PO TABS
1.0000 | ORAL_TABLET | Freq: Once | ORAL | Status: AC
  Administered 2013-11-02: 1 via ORAL
  Filled 2013-11-02: qty 1

## 2013-11-02 MED ORDER — SULFAMETHOXAZOLE-TRIMETHOPRIM 800-160 MG PO TABS: 1.0000 | ORAL_TABLET | Freq: Two times a day (BID) | ORAL | Status: DC

## 2013-11-02 MED ORDER — CEPHALEXIN 500 MG PO CAPS: 500.0000 mg | ORAL_CAPSULE | Freq: Four times a day (QID) | ORAL | Status: DC

## 2013-11-02 MED ORDER — NAPROXEN 500 MG PO TABS: 500.0000 mg | ORAL_TABLET | Freq: Two times a day (BID) | ORAL | Status: DC

## 2013-11-02 MED ORDER — CEPHALEXIN 250 MG PO CAPS
500.0000 mg | ORAL_CAPSULE | Freq: Once | ORAL | Status: AC
  Administered 2013-11-02: 500 mg via ORAL
  Filled 2013-11-02: qty 2

## 2013-11-02 MED ORDER — HYDROCODONE-ACETAMINOPHEN 5-325 MG PO TABS: 1.0000 | ORAL_TABLET | ORAL | Status: DC | PRN

## 2013-11-02 NOTE — ED Provider Notes (Signed)
Medical screening examination/treatment/procedure(s) were performed by non-physician practitioner and as supervising physician I was immediately available for consultation/collaboration.   EKG Interpretation None       Olivia Mackielga M Johnwilliam Shepperson, MD 11/02/13 [phone]

## 2013-11-02 NOTE — ED Provider Notes (Signed)
CSN: [id number]     Arrival date & time 11/01/13  2144 History   First MD Initiated Contact with Patient 11/02/13 0017     Chief Complaint  Patient presents with  . Knee Pain   HPI     Past Medical History  Diagnosis Date  . Hypertension   . Hyperlipidemia   . GERD (gastroesophageal reflux disease)   . Coronary artery disease     NSTEMI 10/11 LHC showed 99% OM2, EF 65%. He had 2.25 X 15 promus DES to PDA. Echo (11/11) showed EF 60% mild to moderate LVH, moderate diastolic dysfunction;  ETT-myoview (10/12): 9' exercise, no ECG changes, EF 61%, normal perfusion images.   Lexiscan Myoview (10/14):  Low risk, no ischemia, EF 48%  . Ex-smoker   . S/P appendectomy   . OSA (obstructive sleep apnea)     CPAP  . ED (erectile dysfunction)   . MI (myocardial infarction) 2011  . Hx of echocardiogram     Echo (10/14):  Mod LVH, EF 55-60%   Past Surgical History  Procedure Laterality Date  . Appendectomy    . Coronary stent placement      x1   Family History  Problem Relation Age of Onset  . Coronary artery disease      grandfather  . Diabetes     History  Substance Use Topics  . Smoking status: Former Games developermoker  . Smokeless tobacco: Not on file  . Alcohol Use: Yes     Comment: occ    Review of Systems  Constitutional: Negative for fever, chills and diaphoresis.  Respiratory: Negative for shortness of breath.   Cardiovascular: Negative for chest pain.  All other systems reviewed and are negative.     Allergies  Penicillins  Home Medications   Prior to Admission medications   Medication Sig Start Date End Date Taking? Authorizing Provider  amLODipine (NORVASC) 10 MG tablet Take 10 mg by mouth daily.   Yes Historical Provider, MD  aspirin EC 81 MG tablet Take 81 mg by mouth every other day.   Yes Historical Provider, MD  atorvastatin (LIPITOR) 80 MG tablet Take 80 mg by mouth at bedtime.   Yes Historical Provider, MD  carvedilol (COREG) 12.5 MG tablet Take 12.5 mg by  mouth 2 (two) times daily with a meal.   Yes Historical Provider, MD  hydrochlorothiazide (MICROZIDE) 12.5 MG capsule Take 12.5 mg by mouth daily.   Yes Historical Provider, MD  lisinopril (PRINIVIL,ZESTRIL) 10 MG tablet Take 10 mg by mouth daily.   Yes Historical Provider, MD  omeprazole (PRILOSEC) 20 MG capsule Take 20 mg by mouth 2 (two) times daily.   Yes Historical Provider, MD  potassium chloride (KLOR-CON) 20 MEQ packet Take 20 mEq by mouth at bedtime.   Yes Historical Provider, MD  traMADol (ULTRAM) 50 MG tablet Take 2 tablets (100 mg total) by mouth 4 (four) times daily. 03/07/13  Yes Freeman CaldronMichael J. Creig, PA-C  nitroGLYCERIN (NITROSTAT) 0.4 MG SL tablet Place 0.4 mg under the tongue every 5 (five) minutes as needed for chest pain.    Historical Provider, MD   BP 131/83  Pulse 54  Temp(Src) 97.9 F (36.6 C) (Oral)  Resp 22  Ht 6' (1.829 m)  Wt 288 lb (130.636 kg)  BMI 39.05 kg/m2  SpO2 100% Physical Exam  Nursing note and vitals reviewed. Constitutional: He is oriented to person, place, and time. He appears well-developed and well-nourished. No distress.  HENT:  Head: Normocephalic.  Cardiovascular:  Normal rate and regular rhythm.   Pulmonary/Chest: Effort normal and breath sounds normal. No respiratory distress. He has no wheezes. He has no rales.  Musculoskeletal: Normal range of motion.  Erythema over the anterior right knee with induration. Area is very tender to palpation. Full range of motion of the knee. Small area of underlying purulent appearance.  Neurological: He is alert and oriented to person, place, and time.    ED Course  Procedures   COORDINATION OF CARE:  Nursing notes reviewed. Vital signs reviewed. Initial pt interview and examination performed.   Filed Vitals:   11/01/13 2153 11/02/13 0021  BP: 123/78 131/83  Pulse: 72 54  Temp: 98.9 F (37.2 C) 97.9 F (36.6 C)  TempSrc: Oral Oral  Resp: 16 22  Height: 6' (1.829 m)   Weight: 288 lb (130.636 kg)    SpO2: 98% 100%    12:27 AM-patient seen and evaluated. Patient is well-appearing in no acute distress. He is afebrile and non-toxic-appearing.  Bedside ultrasound shows diffuse inflammation of the tissue with possible small abscess.   INCISION AND DRAINAGE Performed by: Angus Seller Consent: Verbal consent obtained. Risks and benefits: risks, benefits and alternatives were discussed Type: abscess  Body area: Right knee  Anesthesia: local infiltration  Incision was made with a scalpel.  Local anesthetic: lidocaine 2% without epinephrine  Anesthetic total: 1 ml  Complexity: complex Blunt dissection to break up loculations  Drainage: purulent  Drainage amount: No significant purulent drainage   Packing material: None   Patient tolerance: Patient tolerated the procedure well with no immediate complications.      Treatment plan initiated: Medications  oxyCODONE-acetaminophen (PERCOCET/ROXICET) 5-325 MG per tablet 2 tablet (2 tablets Oral Given 11/01/13 2334)   Results for orders placed during the hospital encounter of 11/02/13  CBC WITH DIFFERENTIAL      Result Value Ref Range   WBC 8.6  4.0 - 10.5 K/uL   RBC 4.71  4.22 - 5.81 MIL/uL   Hemoglobin 13.5  13.0 - 17.0 g/dL   HCT 16.1  09.6 - 04.5 %   MCV 84.1  78.0 - 100.0 fL   MCH 28.7  26.0 - 34.0 pg   MCHC 34.1  30.0 - 36.0 g/dL   RDW 40.9  81.1 - 91.4 %   Platelets 247  150 - 400 K/uL   Neutrophils Relative % 50  43 - 77 %   Neutro Abs 4.3  1.7 - 7.7 K/uL   Lymphocytes Relative 37  12 - 46 %   Lymphs Abs 3.2  0.7 - 4.0 K/uL   Monocytes Relative 11  3 - 12 %   Monocytes Absolute 0.9  0.1 - 1.0 K/uL   Eosinophils Relative 2  0 - 5 %   Eosinophils Absolute 0.2  0.0 - 0.7 K/uL   Basophils Relative 0  0 - 1 %   Basophils Absolute 0.0  0.0 - 0.1 K/uL  I-STAT CHEM 8, ED      Result Value Ref Range   Sodium 144  137 - 147 mEq/L   Potassium 3.4 (*) 3.7 - 5.3 mEq/L   Chloride 102  96 - 112 mEq/L   BUN 13  6 -  23 mg/dL   Creatinine, Ser 7.82 (*) 0.50 - 1.35 mg/dL   Glucose, Bld 956 (*) 70 - 99 mg/dL   Calcium, Ion 2.13  0.86 - 1.23 mmol/L   TCO2 25  0 - 100 mmol/L   Hemoglobin 13.9  13.0 -  17.0 g/dL   HCT 16.141.0  09.639.0 - 04.552.0 %      Imaging Review Dg Knee Complete 4 Views Right  11/01/2013   CLINICAL DATA:  Right knee pain for 1 month  EXAM: RIGHT KNEE - COMPLETE 4+ VIEW  COMPARISON:  DG KNEE 1-2 VIEWS*R* dated 03/05/2013  FINDINGS: There is no evidence of fracture, dislocation, or joint effusion. There is no evidence of arthropathy or other focal bone abnormality. Soft tissues are unremarkable. There is peripheral vascular atherosclerotic disease.  IMPRESSION: No acute osseous injury of the right knee.   Electronically Signed   By: Elige KoHetal  Patel   On: 11/01/2013 22:53     MDM   Final diagnoses:  Cellulitis of knee, right  Abscess of knee, right        Angus Sellereter S Kamariya Blevens, PA-C 11/02/13 0157

## 2013-11-02 NOTE — ED Notes (Signed)
Orson Slick. Dammen PA in room to perform I&D on right knee.

## 2013-11-02 NOTE — Discharge Instructions (Signed)
Please follow up with your primary care provider or an urgent care center for a recheck of your infection in 2 days.  Keep your leg elevated as much as possible to reduce pain and swelling.  Use warm compresses for 20-30 minutes a time to help increase blood flow and fight infection.  Return sooner for any worsening symptoms or fever.    Abscess An abscess is an infected area that contains a collection of pus and debris.It can occur in almost any part of the body. An abscess is also known as a furuncle or boil. CAUSES  An abscess occurs when tissue gets infected. This can occur from blockage of oil or sweat glands, infection of hair follicles, or a minor injury to the skin. As the body tries to fight the infection, pus collects in the area and creates pressure under the skin. This pressure causes pain. People with weakened immune systems have difficulty fighting infections and get certain abscesses more often.  SYMPTOMS Usually an abscess develops on the skin and becomes a painful mass that is red, warm, and tender. If the abscess forms under the skin, you may feel a moveable soft area under the skin. Some abscesses break open (rupture) on their own, but most will continue to get worse without care. The infection can spread deeper into the body and eventually into the bloodstream, causing you to feel ill.  DIAGNOSIS  Your caregiver will take your medical history and perform a physical exam. A sample of fluid may also be taken from the abscess to determine what is causing your infection. TREATMENT  Your caregiver may prescribe antibiotic medicines to fight the infection. However, taking antibiotics alone usually does not cure an abscess. Your caregiver may need to make a small cut (incision) in the abscess to drain the pus. In some cases, gauze is packed into the abscess to reduce pain and to continue draining the area. HOME CARE INSTRUCTIONS   Only take over-the-counter or prescription medicines for  pain, discomfort, or fever as directed by your caregiver.  If you were prescribed antibiotics, take them as directed. Finish them even if you start to feel better.  If gauze is used, follow your caregiver's directions for changing the gauze.  To avoid spreading the infection:  Keep your draining abscess covered with a bandage.  Wash your hands well.  Do not share personal care items, towels, or whirlpools with others.  Avoid skin contact with others.  Keep your skin and clothes clean around the abscess.  Keep all follow-up appointments as directed by your caregiver. SEEK MEDICAL CARE IF:   You have increased pain, swelling, redness, fluid drainage, or bleeding.  You have muscle aches, chills, or a general ill feeling.  You have a fever. MAKE SURE YOU:   Understand these instructions.  Will watch your condition.  Will get help right away if you are not doing well or get worse. Document Released: 03/25/2005 Document Revised: 12/15/2011 Document Reviewed: 08/28/2011 [facility] Patient Information 2014 [place], [place].   Cellulitis Cellulitis is an infection of the skin and the tissue beneath it. The infected area is usually red and tender. Cellulitis occurs most often in the arms and lower legs.  CAUSES  Cellulitis is caused by bacteria that enter the skin through cracks or cuts in the skin. The most common types of bacteria that cause cellulitis are Staphylococcus and Streptococcus. SYMPTOMS   Redness and warmth.  Swelling.  Tenderness or pain.  Fever. DIAGNOSIS  Your caregiver can  usually determine what is wrong based on a physical exam. Blood tests may also be done. TREATMENT  Treatment usually involves taking an antibiotic medicine. HOME CARE INSTRUCTIONS   Take your antibiotics as directed. Finish them even if you start to feel better.  Keep the infected arm or leg elevated to reduce swelling.  Apply a warm cloth to the affected area up to 4 times per day  to relieve pain.  Only take over-the-counter or prescription medicines for pain, discomfort, or fever as directed by your caregiver.  Keep all follow-up appointments as directed by your caregiver. SEEK MEDICAL CARE IF:   You notice red streaks coming from the infected area.  Your red area gets larger or turns dark in color.  Your bone or joint underneath the infected area becomes painful after the skin has healed.  Your infection returns in the same area or another area.  You notice a swollen bump in the infected area.  You develop new symptoms. SEEK IMMEDIATE MEDICAL CARE IF:   You have a fever.  You feel very sleepy.  You develop vomiting or diarrhea.  You have a general ill feeling (malaise) with muscle aches and pains. MAKE SURE YOU:   Understand these instructions.  Will watch your condition.  Will get help right away if you are not doing well or get worse. Document Released: 03/25/2005 Document Revised: 12/15/2011 Document Reviewed: 08/31/2011 [facility] Patient Information 2014 [place], [place].

## 2013-11-04 ENCOUNTER — Emergency Department (INDEPENDENT_AMBULATORY_CARE_PROVIDER_SITE_OTHER)
Admission: EM | Admit: 2013-11-04 | Discharge: 2013-11-04 | Disposition: A | Discharge status: Home / Self Care | Payer: Self-pay | Source: Home / Self Care | Attending: Emergency Medicine | Admitting: Emergency Medicine

## 2013-11-04 ENCOUNTER — Encounter (HOSPITAL_COMMUNITY): Payer: Self-pay | Admitting: Emergency Medicine

## 2013-11-04 DIAGNOSIS — L0390 Cellulitis, unspecified: Secondary | ICD-10-CM

## 2013-11-04 DIAGNOSIS — L0291 Cutaneous abscess, unspecified: Secondary | ICD-10-CM

## 2013-11-04 MED ORDER — OXYCODONE-ACETAMINOPHEN 5-325 MG PO TABS: ORAL_TABLET | ORAL | Status: DC

## 2013-11-04 NOTE — ED Notes (Signed)
Pt  Reports    He  Was  Seen  sev  Days  Ago  For  Wound  Infection  r  Knee     Was  Sent  To  [facility]  And  Is  Here  Today  For a  followup          -  He  Reports  It is  [name] better  But is  Draining  And  Has      Pain on  Weight bearing and  Certain movements

## 2013-11-04 NOTE — Discharge Instructions (Signed)
Now that the packing has been removed from your abscess, you may bathe and shower as normal.  You should change the dressing at least once a day.  You may change it more often if it becomes soiled with drainage.  Wash the wound well with soap and water, pat dry, apply an antibiotic ointment (Neosporin, Polysporin, or Bacitracin are all OK), then cover with a non-stick dressing such as Telfa with several layers of plain gause over that to absorb drainage.  You may secure this in place with tape or with roll gauze and tape.  Keep the wound covered for until it stops draining.  This usually takes 7 days, but may be longer.  Return for a recheck if you have heavy bleeding, increasing pain, fever, or the wound looks worse.  ° °

## 2013-11-04 NOTE — ED Provider Notes (Signed)
  Chief Complaint   Chief Complaint  Patient presents with  . Wound Check    History of Present Illness   [name] is a 48 year old male who has had a one-week history of a painful abscess on his right knee. He has had MRSA in the past. The abscess had been draining, was swollen, red, and painful. He did not have a fever at that time and denies any other skin lesions. He came [facility] to the [facility] and was sent to the emergency department. At the emergency department the abscess was incised and drained. It was not packed and culture was not obtained. He was not given IV antibiotics, but he was sent home on Septra DS which he has been taking since that. The abscess seems to be better. He still has pain with walking and some purulent drainage. He denies any fever or chills. He needs something for pain control.  Review of Systems   Other than as noted above, the patient denies any of the following symptoms: Systemic:  No fever, chills or sweats. Skin:  No rash or itching.  PMFSH   Past medical history, family history, social history, meds, and allergies were reviewed. He is allergic to penicillins. He has a history of hypertension, hypercholesterolemia, and myocardial infarction. Current meds include Septra, Vicodin, and naproxen.  Physical Examination     Vital signs:  BP 128/70  Pulse 65  Temp(Src) 98 F (36.7 C) (Oral)  Resp 17  SpO2 98% Skin:  Exam reveals an abscess on the right knee with still some surrounding erythema and tenderness to palpation. The I&D site was not draining any pus.  There was no crepitus, necrosis, ecchymosis, or herrhagic bullae. Skin exam was otherwise normal.  No rash. Ext:  Distal pulses were full, patient has full ROM of all joints.  Course in Urgent Care Center   The wound was cleansed with saline, antibiotic ointment was applied, and it was redressed.  Assessment   The encounter diagnosis was Abscess.  Status post incision and  draining. Appears to be healing up slowly.  Plan     1.  Meds:  The following meds were prescribed:   Discharge Medication List as of 11/04/2013  9:38 AM    START taking these medications   Details  oxyCODONE-acetaminophen (PERCOCET) 5-325 MG per tablet 1 to 2 tablets every 6 hours as needed for pain., Print        2.  Patient Education/Counseling:  The patient was given appropriate handouts, self care instructions, and instructed in symptomatic relief.  He was instructed in wound care. Should finish up antibiotics. Return here in a week if no better or sooner if he becomes worse.  3.  Follow up:  The patient was instructed to leave the dressing in place and return here again in 48 hours for packing removal, or sooner if becoming worse in any way, and given some red flag symptoms such as fever which would prompt immediate return.       Reuben Likesavid C Robbi Spells, MD 11/04/13 [phone]

## 2014-07-06 ENCOUNTER — Ambulatory Visit: Payer: Self-pay | Admitting: Physician Assistant

## 2014-08-13 ENCOUNTER — Ambulatory Visit: Payer: Self-pay | Admitting: Cardiology

## 2014-08-22 ENCOUNTER — Encounter: Payer: Self-pay | Admitting: Physician Assistant

## 2014-09-02 ENCOUNTER — Ambulatory Visit: Payer: BLUE CROSS/BLUE SHIELD

## 2014-09-03 ENCOUNTER — Ambulatory Visit: Payer: Self-pay | Admitting: Physician Assistant

## 2014-09-20 ENCOUNTER — Ambulatory Visit: Payer: Self-pay | Admitting: Physician Assistant

## 2014-10-08 ENCOUNTER — Ambulatory Visit: Payer: Self-pay | Admitting: Physician Assistant

## 2015-08-13 ENCOUNTER — Encounter: Payer: Self-pay | Admitting: Family

## 2015-08-24 ENCOUNTER — Emergency Department (HOSPITAL_COMMUNITY): Payer: BLUE CROSS/BLUE SHIELD

## 2015-08-24 ENCOUNTER — Encounter (HOSPITAL_COMMUNITY): Payer: Self-pay | Admitting: *Deleted

## 2015-08-24 ENCOUNTER — Emergency Department (HOSPITAL_COMMUNITY)
Admission: EM | Admit: 2015-08-24 | Discharge: 2015-08-24 | Disposition: A | Discharge status: Home / Self Care | Payer: BLUE CROSS/BLUE SHIELD | Attending: Emergency Medicine | Admitting: Emergency Medicine

## 2015-08-24 DIAGNOSIS — Z791 Long term (current) use of non-steroidal anti-inflammatories (NSAID): Secondary | ICD-10-CM | POA: Insufficient documentation

## 2015-08-24 DIAGNOSIS — E669 Obesity, unspecified: Secondary | ICD-10-CM | POA: Insufficient documentation

## 2015-08-24 DIAGNOSIS — Z880 Allergy status to penicillin: Secondary | ICD-10-CM | POA: Diagnosis not present

## 2015-08-24 DIAGNOSIS — R531 Weakness: Secondary | ICD-10-CM | POA: Diagnosis not present

## 2015-08-24 DIAGNOSIS — Z792 Long term (current) use of antibiotics: Secondary | ICD-10-CM | POA: Diagnosis not present

## 2015-08-24 DIAGNOSIS — Z87891 Personal history of nicotine dependence: Secondary | ICD-10-CM | POA: Diagnosis not present

## 2015-08-24 DIAGNOSIS — R079 Chest pain, unspecified: Secondary | ICD-10-CM | POA: Diagnosis present

## 2015-08-24 DIAGNOSIS — Z9049 Acquired absence of other specified parts of digestive tract: Secondary | ICD-10-CM | POA: Diagnosis not present

## 2015-08-24 DIAGNOSIS — R42 Dizziness and giddiness: Secondary | ICD-10-CM | POA: Diagnosis not present

## 2015-08-24 DIAGNOSIS — G4733 Obstructive sleep apnea (adult) (pediatric): Secondary | ICD-10-CM | POA: Diagnosis not present

## 2015-08-24 DIAGNOSIS — I2510 Atherosclerotic heart disease of native coronary artery without angina pectoris: Secondary | ICD-10-CM | POA: Insufficient documentation

## 2015-08-24 DIAGNOSIS — R509 Fever, unspecified: Secondary | ICD-10-CM | POA: Insufficient documentation

## 2015-08-24 DIAGNOSIS — Z7982 Long term (current) use of aspirin: Secondary | ICD-10-CM | POA: Insufficient documentation

## 2015-08-24 DIAGNOSIS — R0789 Other chest pain: Secondary | ICD-10-CM | POA: Diagnosis not present

## 2015-08-24 DIAGNOSIS — R600 Localized edema: Secondary | ICD-10-CM | POA: Diagnosis not present

## 2015-08-24 DIAGNOSIS — Z79899 Other long term (current) drug therapy: Secondary | ICD-10-CM | POA: Diagnosis not present

## 2015-08-24 DIAGNOSIS — Z9861 Coronary angioplasty status: Secondary | ICD-10-CM | POA: Insufficient documentation

## 2015-08-24 DIAGNOSIS — E785 Hyperlipidemia, unspecified: Secondary | ICD-10-CM | POA: Insufficient documentation

## 2015-08-24 DIAGNOSIS — I10 Essential (primary) hypertension: Secondary | ICD-10-CM | POA: Diagnosis not present

## 2015-08-24 DIAGNOSIS — K219 Gastro-esophageal reflux disease without esophagitis: Secondary | ICD-10-CM | POA: Diagnosis not present

## 2015-08-24 DIAGNOSIS — I252 Old myocardial infarction: Secondary | ICD-10-CM | POA: Insufficient documentation

## 2015-08-24 DIAGNOSIS — Z87438 Personal history of other diseases of male genital organs: Secondary | ICD-10-CM | POA: Insufficient documentation

## 2015-08-24 LAB — BASIC METABOLIC PANEL
Anion gap: 12 (ref 5–15)
BUN: 13 mg/dL (ref 6–20)
CO2: 25 mmol/L (ref 22–32)
CREATININE: 1.42 mg/dL — AB (ref 0.61–1.24)
Calcium: 9 mg/dL (ref 8.9–10.3)
Chloride: 104 mmol/L (ref 101–111)
GFR, EST NON AFRICAN AMERICAN: 56 mL/min — AB (ref >60)
Glucose, Bld: 109 mg/dL — ABNORMAL HIGH (ref 65–99)
Potassium: 3.8 mmol/L (ref 3.5–5.1)
Sodium: 141 mmol/L (ref 135–145)

## 2015-08-24 LAB — I-STAT TROPONIN, ED
TROPONIN I, POC: 0 ng/mL (ref 0.00–0.08)
Troponin i, poc: 0.01 ng/mL (ref 0.00–0.08)

## 2015-08-24 LAB — CBC
HCT: 41.4 % (ref 39.0–52.0)
Hemoglobin: 13.9 g/dL (ref 13.0–17.0)
MCH: 27.9 pg (ref 26.0–34.0)
MCHC: 33.6 g/dL (ref 30.0–36.0)
MCV: 83.1 fL (ref 78.0–100.0)
PLATELETS: 204 10*3/uL (ref 150–400)
RBC: 4.98 MIL/uL (ref 4.22–5.81)
RDW: 13.3 % (ref 11.5–15.5)
WBC: 7.6 10*3/uL (ref 4.0–10.5)

## 2015-08-24 LAB — I-STAT CG4 LACTIC ACID, ED
LACTIC ACID, VENOUS: 1.16 mmol/L (ref 0.5–2.0)
Lactic Acid, Venous: 0.88 mmol/L (ref 0.5–2.0)

## 2015-08-24 MED ORDER — GI COCKTAIL ~~LOC~~
30.0000 mL | Freq: Once | ORAL | Status: AC
  Administered 2015-08-24: 30 mL via ORAL
  Filled 2015-08-24: qty 30

## 2015-08-24 MED ORDER — SODIUM CHLORIDE 0.9 % IV BOLUS (SEPSIS)
1000.0000 mL | Freq: Once | INTRAVENOUS | Status: AC
  Administered 2015-08-24: 1000 mL via INTRAVENOUS

## 2015-08-24 NOTE — ED Notes (Signed)
Pt states that he had a colonoscopy today and awoke with chest pain. States that he is unsure if it was gas. States he relieved some gas and now it feels a little better.

## 2015-08-24 NOTE — ED Provider Notes (Signed)
CSN: [id number]     Arrival date & time 08/24/15  0128 History  By signing my name below, I, Doreatha Martin, attest that this documentation has been prepared under the direction and in the presence of Shon Baton, MD. Electronically Signed: Doreatha Martin, ED Scribe. 08/24/2015. 2:30 AM.    Chief Complaint  Patient presents with  . Chest Pain   The history is provided by the patient. No language interpreter was used.   HPI Comments: [name] is a 50 y.o. male with h/o HTN, HLD, GERD, CAD, OSA, MI, coronary stent who presents to the Emergency Department complaining of moderate, dull, non-radiating, improving 3/10 substernal CP onset 2 hours ago after waking up with associated paresthesia to bilateral hands, dizziness, generalized weakness. Pt states that he had a routine colonoscopy this morning and initially attributed his symptoms to gas. He notes he was sedated for this procedure. He states he has taken ASA and Gas-X with moderate relief of symptoms. Pt states this pain is not similar to when he had a stent placement. He denies SOB, nausea.   Past Medical History  Diagnosis Date  . Hypertension   . Hyperlipidemia   . GERD (gastroesophageal reflux disease)   . Coronary artery disease     NSTEMI 10/11 LHC showed 99% OM2, EF 65%. He had 2.25 X 15 promus DES to PDA. Echo (11/11) showed EF 60% mild to moderate LVH, moderate diastolic dysfunction;  ETT-myoview (10/12): 9' exercise, no ECG changes, EF 61%, normal perfusion images.   Lexiscan Myoview (10/14):  Low risk, no ischemia, EF 48%  . Ex-smoker   . S/P appendectomy   . OSA (obstructive sleep apnea)     CPAP  . ED (erectile dysfunction)   . MI (myocardial infarction) (HCC) 2011  . Hx of echocardiogram     Echo (10/14):  Mod LVH, EF 55-60%   Past Surgical History  Procedure Laterality Date  . Appendectomy    . Coronary stent placement      x1   Family History  Problem Relation Age of Onset  . Coronary artery disease       grandfather  . Diabetes     Social History  Substance Use Topics  . Smoking status: Former Games developer  . Smokeless tobacco: None  . Alcohol Use: Yes     Comment: occ    Review of Systems  Constitutional: Positive for fever.  Respiratory: Negative for shortness of breath.   Cardiovascular: Positive for chest pain.  Gastrointestinal: Negative for nausea.  Neurological: Positive for dizziness and weakness ( generalized).  All other systems reviewed and are negative.  Allergies  Penicillins  Home Medications   Prior to Admission medications   Medication Sig Start Date End Date Taking? Authorizing Provider  amLODipine (NORVASC) 10 MG tablet Take 10 mg by mouth daily.    Historical Provider, MD  aspirin EC 81 MG tablet Take 81 mg by mouth every other day.    Historical Provider, MD  atorvastatin (LIPITOR) 80 MG tablet Take 80 mg by mouth at bedtime.    Historical Provider, MD  carvedilol (COREG) 12.5 MG tablet Take 12.5 mg by mouth 2 (two) times daily with a meal.    Historical Provider, MD  cephALEXin (KEFLEX) 500 MG capsule Take 1 capsule (500 mg total) by mouth 4 (four) times daily. 11/02/13   Ivonne Andrew, PA-C  hydrochlorothiazide (MICROZIDE) 12.5 MG capsule Take 12.5 mg by mouth daily.    Historical Provider, MD  HYDROcodone-acetaminophen (  NORCO/VICODIN) 5-325 MG per tablet Take 1-2 tablets by mouth every 4 (four) hours as needed for moderate pain. 11/02/13   Ivonne Andrew, PA-C  lisinopril (PRINIVIL,ZESTRIL) 10 MG tablet Take 10 mg by mouth daily.    Historical Provider, MD  naproxen (NAPROSYN) 500 MG tablet Take 1 tablet (500 mg total) by mouth 2 (two) times daily. 11/02/13   Ivonne Andrew, PA-C  nitroGLYCERIN (NITROSTAT) 0.4 MG SL tablet Place 0.4 mg under the tongue every 5 (five) minutes as needed for chest pain.    Historical Provider, MD  omeprazole (PRILOSEC) 20 MG capsule Take 20 mg by mouth 2 (two) times daily.    Historical Provider, MD  oxyCODONE-acetaminophen (PERCOCET) 5-325 MG  per tablet 1 to 2 tablets every 6 hours as needed for pain. 11/04/13   Reuben Likes, MD  potassium chloride (KLOR-CON) 20 MEQ packet Take 20 mEq by mouth at bedtime.    Historical Provider, MD  sulfamethoxazole-trimethoprim (SEPTRA DS) 800-160 MG per tablet Take 1 tablet by mouth 2 (two) times daily. 11/02/13   Ivonne Andrew, PA-C  traMADol (ULTRAM) 50 MG tablet Take 2 tablets (100 mg total) by mouth 4 (four) times daily. 03/07/13   Freeman Caldron, PA-C   BP 121/64 mmHg  Pulse 65  Temp(Src) 99.5 F (37.5 C) (Oral)  Resp 22  Ht 6' (1.829 m)  Wt 285 lb (129.275 kg)  BMI 38.64 kg/m2  SpO2 99% Physical Exam  Constitutional: He is oriented to person, place, and time. He appears well-developed and well-nourished. No distress.  Obese  HENT:  Head: Normocephalic and atraumatic.  Cardiovascular: Normal rate, regular rhythm and normal heart sounds.   No murmur heard. Pulmonary/Chest: Effort normal and breath sounds normal. No respiratory distress. He has no wheezes.  Abdominal: Soft. Bowel sounds are normal. There is no tenderness. There is no rebound.  Musculoskeletal: He exhibits edema.  Trace bilateral lower extremity edema  Neurological: He is alert and oriented to person, place, and time.  Skin: Skin is warm and dry.  Psychiatric: He has a normal mood and affect.  Nursing note and vitals reviewed.   ED Course  Procedures (including critical care time) DIAGNOSTIC STUDIES: Oxygen Saturation is 98% on RA, normal by my interpretation.    COORDINATION OF CARE: 2:20 AM Discussed treatment plan with pt at bedside and pt agreed to plan.   Labs Review Labs Reviewed  BASIC METABOLIC PANEL - Abnormal; Notable for the following:    Glucose, Bld 109 (*)    Creatinine, Ser 1.42 (*)    GFR calc non Af Amer 56 (*)    All other components within normal limits  CBC  I-STAT TROPOININ, ED  I-STAT CG4 LACTIC ACID, ED  I-STAT TROPOININ, ED  I-STAT CG4 LACTIC ACID, ED    Imaging Review Dg  Chest 2 View  08/24/2015  CLINICAL DATA:  Acute onset of generalized chest pain and mild fever, status post recent colonoscopy. Initial encounter. EXAM: CHEST  2 VIEW COMPARISON:  Chest radiograph and CT of the chest performed 03/05/2013 FINDINGS: The lungs are well-aerated. Minimal bibasilar atelectasis is noted. There is no evidence of pleural effusion or pneumothorax. The heart is normal in size; the mediastinal contour is within normal limits. No acute osseous abnormalities are seen. IMPRESSION: Minimal bibasilar atelectasis noted.  Lungs otherwise clear. Electronically Signed   By: Roanna Raider M.D.   On: 08/24/2015 02:22   I have personally reviewed and evaluated these images and lab results as part of  my medical decision-making.   EKG Interpretation   Date/Time:  Saturday August 24 2015 01:33:45 EST Ventricular Rate:  83 PR Interval:  168 QRS Duration: 76 QT Interval:  364 QTC Calculation: 427 R Axis:   33 Text Interpretation:  Normal sinus rhythm Normal ECG Confirmed by Dorance Spink   MD, Journi Moffa ([id number]) on 08/24/2015 2:05:11 AM      MDM   Final diagnoses:  Other chest pain  Gastroesophageal reflux disease, esophagitis presence not specified    Patient presents with chest pain. This is in the setting and recent colonoscopy. Somewhat improved with Gas-X. Is not like his prior MIs. EKG is nonischemic. Chest x-ray shows no evidence of free air and is otherwise reassuring. Troponin 2 is negative. Patient improved with Protonix and a GI cocktail. He does have a heart history but his symptoms are very atypical. Will discharge with close cardiology follow-up as well as GI follow-up. Patient is ambulatory independently and otherwise well-appearing at discharge.  After history, exam, and medical workup I feel the patient has been appropriately medically screened and is safe for discharge home. Pertinent diagnoses were discussed with the patient. Patient was given return precautions.  I  personally performed the services described in this documentation, which was scribed in my presence. The recorded information has been reviewed and is accurate.    Shon Baton, MD 08/24/15 [phone]

## 2015-08-24 NOTE — ED Notes (Signed)
Environmental education officer

## 2015-08-24 NOTE — Discharge Instructions (Signed)
Nonspecific Chest Pain  °Chest pain can be caused by many different conditions. There is always a chance that your pain could be related to something serious, such as a heart attack or a blood clot in your lungs. Chest pain can also be caused by conditions that are not life-threatening. If you have chest pain, it is very important to follow up with your health care provider. °CAUSES  °Chest pain can be caused by: °· Heartburn. °· Pneumonia or bronchitis. °· Anxiety or stress. °· Inflammation around your heart (pericarditis) or lung (pleuritis or pleurisy). °· A blood clot in your lung. °· A collapsed lung (pneumothorax). It can develop suddenly on its own (spontaneous pneumothorax) or from trauma to the chest. °· Shingles infection (varicella-zoster virus). °· Heart attack. °· Damage to the bones, muscles, and cartilage that make up your chest wall. This can include: °¨ Bruised bones due to injury. °¨ Strained muscles or cartilage due to frequent or repeated coughing or overwork. °¨ Fracture to one or more ribs. °¨ Sore cartilage due to inflammation (costochondritis). °RISK FACTORS  °Risk factors for chest pain may include: °· Activities that increase your risk for trauma or injury to your chest. °· Respiratory infections or conditions that cause frequent coughing. °· Medical conditions or overeating that can cause heartburn. °· Heart disease or family history of heart disease. °· Conditions or health behaviors that increase your risk of developing a blood clot. °· Having had chicken pox (varicella zoster). °SIGNS AND SYMPTOMS °Chest pain can feel like: °· Burning or tingling on the surface of your chest or deep in your chest. °· Crushing, pressure, aching, or squeezing pain. °· Dull or sharp pain that is worse when you move, cough, or take a deep breath. °· Pain that is also felt in your back, neck, shoulder, or arm, or pain that spreads to any of these areas. °Your chest pain may come and go, or it may stay  constant. °DIAGNOSIS °Lab tests or other studies may be needed to find the cause of your pain. Your health care provider may have you take a test called an ambulatory ECG (electrocardiogram). An ECG records your heartbeat patterns at the time the test is performed. You may also have other tests, such as: °· Transthoracic echocardiogram (TTE). During echocardiography, sound waves are used to create a picture of all of the heart structures and to look at how blood flows through your heart. °· Transesophageal echocardiogram (TEE). This is a more advanced imaging test that obtains images from inside your body. It allows your health care provider to see your heart in finer detail. °· Cardiac monitoring. This allows your health care provider to monitor your heart rate and rhythm in real time. °· Holter monitor. This is a portable device that records your heartbeat and can help to diagnose abnormal heartbeats. It allows your health care provider to track your heart activity for several days, if needed. °· Stress tests. These can be done through exercise or by taking medicine that makes your heart beat more quickly. °· Blood tests. °· Imaging tests. °TREATMENT  °Your treatment depends on what is causing your chest pain. Treatment may include: °· Medicines. These may include: °¨ Acid blockers for heartburn. °¨ Anti-inflammatory medicine. °¨ Pain medicine for inflammatory conditions. °¨ Antibiotic medicine, if an infection is present. °¨ Medicines to dissolve blood clots. °¨ Medicines to treat coronary artery disease. °· Supportive care for conditions that do not require medicines. This may include: °¨ Resting. °¨ Applying heat   or cold packs to injured areas. °¨ Limiting activities until pain decreases. °HOME CARE INSTRUCTIONS °· If you were prescribed an antibiotic medicine, finish it all even if you start to feel better. °· Avoid any activities that bring on chest pain. °· Do not use any tobacco products, including  cigarettes, chewing tobacco, or electronic cigarettes. If you need help quitting, ask your health care provider. °· Do not drink alcohol. °· Take medicines only as directed by your health care provider. °· Keep all follow-up visits as directed by your health care provider. This is important. This includes any further testing if your chest pain does not go away. °· If heartburn is the cause for your chest pain, you may be told to keep your head raised (elevated) while sleeping. This reduces the chance that acid will go from your stomach into your esophagus. °· Make lifestyle changes as directed by your health care provider. These may include: °¨ Getting regular exercise. Ask your health care provider to suggest some activities that are safe for you. °¨ Eating a heart-healthy diet. A registered dietitian can help you to learn healthy eating options. °¨ Maintaining a healthy weight. °¨ Managing diabetes, if necessary. °¨ Reducing stress. °SEEK MEDICAL CARE IF: °· Your chest pain does not go away after treatment. °· You have a rash with blisters on your chest. °· You have a fever. °SEEK IMMEDIATE MEDICAL CARE IF:  °· Your chest pain is worse. °· You have an increasing cough, or you cough up blood. °· You have severe abdominal pain. °· You have severe weakness. °· You faint. °· You have chills. °· You have sudden, unexplained chest discomfort. °· You have sudden, unexplained discomfort in your arms, back, neck, or jaw. °· You have shortness of breath at any time. °· You suddenly start to sweat, or your skin gets clammy. °· You feel nauseous or you vomit. °· You suddenly feel light-headed or dizzy. °· Your heart begins to beat quickly, or it feels like it is skipping beats. °These symptoms may represent a serious problem that is an emergency. Do not wait to see if the symptoms will go away. Get medical help right away. Call your local emergency services (911 in the [place] Do not drive yourself to the hospital. °  °This  information is not intended to replace advice given to you by your health care provider. Make sure you discuss any questions you have with your health care provider. °  °Document Released: 03/25/2005 Document Revised: 07/06/2014 Document Reviewed: 01/19/2014 °Elsevier Interactive Patient Education ©2016 [employer] ° °

## 2016-04-10 ENCOUNTER — Ambulatory Visit: Payer: BLUE CROSS/BLUE SHIELD | Admitting: Cardiology

## 2016-04-28 ENCOUNTER — Encounter: Payer: Self-pay | Admitting: Cardiology

## 2016-05-12 ENCOUNTER — Encounter: Payer: Self-pay | Admitting: Cardiology

## 2016-05-12 ENCOUNTER — Ambulatory Visit (INDEPENDENT_AMBULATORY_CARE_PROVIDER_SITE_OTHER): Payer: BLUE CROSS/BLUE SHIELD | Admitting: Cardiology

## 2016-05-12 VITALS — BP 128/90 | HR 65 | Ht 72.0 in | Wt 287.4 lb

## 2016-05-12 DIAGNOSIS — E785 Hyperlipidemia, unspecified: Secondary | ICD-10-CM

## 2016-05-12 DIAGNOSIS — I2510 Atherosclerotic heart disease of native coronary artery without angina pectoris: Secondary | ICD-10-CM

## 2016-05-12 LAB — BASIC METABOLIC PANEL
BUN: 15 mg/dL (ref 7–25)
CALCIUM: 9.3 mg/dL (ref 8.6–10.3)
CO2: 30 mmol/L (ref 20–31)
Chloride: 102 mmol/L (ref 98–110)
Creat: 1.22 mg/dL (ref 0.70–1.33)
Glucose, Bld: 90 mg/dL (ref 65–99)
POTASSIUM: 3.6 mmol/L (ref 3.5–5.3)
Sodium: 140 mmol/L (ref 135–146)

## 2016-05-12 LAB — LIPID PANEL
Cholesterol: 155 mg/dL (ref <200)
HDL: 61 mg/dL (ref >40)
LDL CALC: 71 mg/dL (ref <100)
Total CHOL/HDL Ratio: 2.5 Ratio (ref <5.0)
Triglycerides: 114 mg/dL (ref <150)
VLDL: 23 mg/dL (ref <30)

## 2016-05-12 NOTE — Patient Instructions (Signed)
Medication Instructions:  Your physician recommends that you continue on your current medications as directed. Please refer to the Current Medication list given to you today.   Labwork: Lipid profile/BMET today   Testing/Procedures: none  Follow-Up: Your physician wants you to follow-up in: 1 year with Dr End. (November 2018).  You will receive a reminder letter in the mail two months in advance. If you don't receive a letter, please call our office to schedule the follow-up appointment.        If you need a refill on your cardiac medications before your next appointment, please call your pharmacy.
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
## 2017-03-09 ENCOUNTER — Encounter (HOSPITAL_COMMUNITY): Payer: Self-pay | Admitting: Nurse Practitioner

## 2017-03-09 ENCOUNTER — Observation Stay (HOSPITAL_COMMUNITY)
Admission: EM | Admit: 2017-03-09 | Discharge: 2017-03-11 | Disposition: A | Discharge status: Home / Self Care | Payer: BLUE CROSS/BLUE SHIELD | Attending: Family Medicine | Admitting: Family Medicine

## 2017-03-09 ENCOUNTER — Emergency Department (HOSPITAL_COMMUNITY): Payer: BLUE CROSS/BLUE SHIELD

## 2017-03-09 DIAGNOSIS — R202 Paresthesia of skin: Secondary | ICD-10-CM | POA: Diagnosis present

## 2017-03-09 DIAGNOSIS — Z79899 Other long term (current) drug therapy: Secondary | ICD-10-CM | POA: Diagnosis not present

## 2017-03-09 DIAGNOSIS — I252 Old myocardial infarction: Secondary | ICD-10-CM | POA: Diagnosis not present

## 2017-03-09 DIAGNOSIS — Z7982 Long term (current) use of aspirin: Secondary | ICD-10-CM | POA: Insufficient documentation

## 2017-03-09 DIAGNOSIS — G8191 Hemiplegia, unspecified affecting right dominant side: Secondary | ICD-10-CM | POA: Diagnosis not present

## 2017-03-09 DIAGNOSIS — Z955 Presence of coronary angioplasty implant and graft: Secondary | ICD-10-CM | POA: Diagnosis not present

## 2017-03-09 DIAGNOSIS — Z87891 Personal history of nicotine dependence: Secondary | ICD-10-CM | POA: Insufficient documentation

## 2017-03-09 DIAGNOSIS — M50221 Other cervical disc displacement at C4-C5 level: Secondary | ICD-10-CM | POA: Insufficient documentation

## 2017-03-09 DIAGNOSIS — Z9989 Dependence on other enabling machines and devices: Secondary | ICD-10-CM

## 2017-03-09 DIAGNOSIS — K219 Gastro-esophageal reflux disease without esophagitis: Secondary | ICD-10-CM | POA: Diagnosis not present

## 2017-03-09 DIAGNOSIS — G4733 Obstructive sleep apnea (adult) (pediatric): Secondary | ICD-10-CM | POA: Diagnosis not present

## 2017-03-09 DIAGNOSIS — E785 Hyperlipidemia, unspecified: Secondary | ICD-10-CM | POA: Diagnosis not present

## 2017-03-09 DIAGNOSIS — R531 Weakness: Secondary | ICD-10-CM | POA: Insufficient documentation

## 2017-03-09 DIAGNOSIS — R2689 Other abnormalities of gait and mobility: Secondary | ICD-10-CM | POA: Diagnosis not present

## 2017-03-09 DIAGNOSIS — I10 Essential (primary) hypertension: Secondary | ICD-10-CM | POA: Diagnosis not present

## 2017-03-09 DIAGNOSIS — Z880 Allergy status to penicillin: Secondary | ICD-10-CM | POA: Diagnosis not present

## 2017-03-09 DIAGNOSIS — R32 Unspecified urinary incontinence: Secondary | ICD-10-CM | POA: Diagnosis not present

## 2017-03-09 DIAGNOSIS — I2510 Atherosclerotic heart disease of native coronary artery without angina pectoris: Secondary | ICD-10-CM | POA: Insufficient documentation

## 2017-03-09 LAB — CBC
HEMATOCRIT: 41.4 % (ref 39.0–52.0)
HEMOGLOBIN: 14.2 g/dL (ref 13.0–17.0)
MCH: 28.5 pg (ref 26.0–34.0)
MCHC: 34.3 g/dL (ref 30.0–36.0)
MCV: 83.1 fL (ref 78.0–100.0)
Platelets: 222 10*3/uL (ref 150–400)
RBC: 4.98 MIL/uL (ref 4.22–5.81)
RDW: 13.4 % (ref 11.5–15.5)
WBC: 7.4 10*3/uL (ref 4.0–10.5)

## 2017-03-09 LAB — COMPREHENSIVE METABOLIC PANEL
ALT: 27 U/L (ref 17–63)
ANION GAP: 9 (ref 5–15)
AST: 25 U/L (ref 15–41)
Albumin: 4.4 g/dL (ref 3.5–5.0)
Alkaline Phosphatase: 57 U/L (ref 38–126)
BILIRUBIN TOTAL: 0.6 mg/dL (ref 0.3–1.2)
BUN: 10 mg/dL (ref 6–20)
CHLORIDE: 104 mmol/L (ref 101–111)
CO2: 24 mmol/L (ref 22–32)
Calcium: 9 mg/dL (ref 8.9–10.3)
Creatinine, Ser: 0.99 mg/dL (ref 0.61–1.24)
Glucose, Bld: 131 mg/dL — ABNORMAL HIGH (ref 65–99)
POTASSIUM: 3 mmol/L — AB (ref 3.5–5.1)
Sodium: 137 mmol/L (ref 135–145)
TOTAL PROTEIN: 7.6 g/dL (ref 6.5–8.1)

## 2017-03-09 LAB — I-STAT CHEM 8, ED
BUN: 14 mg/dL (ref 6–20)
CALCIUM ION: 1.09 mmol/L — AB (ref 1.15–1.40)
CREATININE: 1 mg/dL (ref 0.61–1.24)
Chloride: 100 mmol/L — ABNORMAL LOW (ref 101–111)
GLUCOSE: 129 mg/dL — AB (ref 65–99)
HCT: 42 % (ref 39.0–52.0)
HEMOGLOBIN: 14.3 g/dL (ref 13.0–17.0)
POTASSIUM: 3.5 mmol/L (ref 3.5–5.1)
Sodium: 141 mmol/L (ref 135–145)
TCO2: 30 mmol/L (ref 22–32)

## 2017-03-09 LAB — DIFFERENTIAL
BASOS ABS: 0 10*3/uL (ref 0.0–0.1)
BASOS PCT: 0 %
EOS ABS: 0.4 10*3/uL (ref 0.0–0.7)
EOS PCT: 5 %
LYMPHS ABS: 2.9 10*3/uL (ref 0.7–4.0)
Lymphocytes Relative: 39 %
MONO ABS: 0.6 10*3/uL (ref 0.1–1.0)
MONOS PCT: 8 %
Neutro Abs: 3.5 10*3/uL (ref 1.7–7.7)
Neutrophils Relative %: 48 %

## 2017-03-09 LAB — PROTIME-INR
INR: 1.05
Prothrombin Time: 13.6 seconds (ref 11.4–15.2)

## 2017-03-09 LAB — URINALYSIS, ROUTINE W REFLEX MICROSCOPIC
BILIRUBIN URINE: NEGATIVE
GLUCOSE, UA: NEGATIVE mg/dL
HGB URINE DIPSTICK: NEGATIVE
Ketones, ur: NEGATIVE mg/dL
Leukocytes, UA: NEGATIVE
Nitrite: NEGATIVE
PROTEIN: NEGATIVE mg/dL
Specific Gravity, Urine: 1.017 (ref 1.005–1.030)
pH: 5 (ref 5.0–8.0)

## 2017-03-09 LAB — CBG MONITORING, ED: GLUCOSE-CAPILLARY: 106 mg/dL — AB (ref 65–99)

## 2017-03-09 LAB — I-STAT TROPONIN, ED: TROPONIN I, POC: 0 ng/mL (ref 0.00–0.08)

## 2017-03-09 LAB — APTT: APTT: 29 s (ref 24–36)

## 2017-03-09 MED ORDER — POTASSIUM CHLORIDE CRYS ER 20 MEQ PO TBCR: 40.0000 meq | EXTENDED_RELEASE_TABLET | Freq: Once | ORAL | Status: DC
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
## 2017-03-09 NOTE — ED Triage Notes (Addendum)
Pt presents with c/o stroke symptoms. His symptoms began this morning around 0830 am. He noticed numbness in his right arm. He describes as pins and needles. The numbness has begun to radiate into his right leg and bilateral feet this afternoon. He has also felt off balance today. He denies pain, confusion, facial droop, slurred speech. He was seen by his primary care provider this morning and instructed to follow up in ED if symptoms worsened. I discussed this case with Dr Rubin PayorPickering and orders for non-emergent stroke order set were initiated

## 2017-03-09 NOTE — ED Notes (Signed)
No answer for treatment room. 

## 2017-03-09 NOTE — ED Notes (Signed)
Neurology and admitting md at bedside. VSS.

## 2017-03-10 ENCOUNTER — Encounter (HOSPITAL_COMMUNITY): Payer: Self-pay | Admitting: *Deleted

## 2017-03-10 ENCOUNTER — Inpatient Hospital Stay (HOSPITAL_COMMUNITY): Payer: BLUE CROSS/BLUE SHIELD

## 2017-03-10 ENCOUNTER — Inpatient Hospital Stay (HOSPITAL_BASED_OUTPATIENT_CLINIC_OR_DEPARTMENT_OTHER): Payer: BLUE CROSS/BLUE SHIELD

## 2017-03-10 DIAGNOSIS — G8191 Hemiplegia, unspecified affecting right dominant side: Secondary | ICD-10-CM

## 2017-03-10 DIAGNOSIS — R202 Paresthesia of skin: Secondary | ICD-10-CM | POA: Diagnosis not present

## 2017-03-10 DIAGNOSIS — G4733 Obstructive sleep apnea (adult) (pediatric): Secondary | ICD-10-CM

## 2017-03-10 DIAGNOSIS — G451 Carotid artery syndrome (hemispheric): Secondary | ICD-10-CM | POA: Diagnosis not present

## 2017-03-10 DIAGNOSIS — Z9989 Dependence on other enabling machines and devices: Secondary | ICD-10-CM | POA: Diagnosis not present

## 2017-03-10 DIAGNOSIS — I6789 Other cerebrovascular disease: Secondary | ICD-10-CM | POA: Diagnosis not present

## 2017-03-10 DIAGNOSIS — I2510 Atherosclerotic heart disease of native coronary artery without angina pectoris: Secondary | ICD-10-CM | POA: Diagnosis not present

## 2017-03-10 DIAGNOSIS — I10 Essential (primary) hypertension: Secondary | ICD-10-CM | POA: Diagnosis not present

## 2017-03-10 DIAGNOSIS — M5412 Radiculopathy, cervical region: Secondary | ICD-10-CM | POA: Diagnosis not present

## 2017-03-10 DIAGNOSIS — E782 Mixed hyperlipidemia: Secondary | ICD-10-CM | POA: Diagnosis not present

## 2017-03-10 LAB — CBC
HEMATOCRIT: 40.5 % (ref 39.0–52.0)
HEMOGLOBIN: 13.4 g/dL (ref 13.0–17.0)
MCH: 27.8 pg (ref 26.0–34.0)
MCHC: 33.1 g/dL (ref 30.0–36.0)
MCV: 84 fL (ref 78.0–100.0)
Platelets: 212 10*3/uL (ref 150–400)
RBC: 4.82 MIL/uL (ref 4.22–5.81)
RDW: 13.7 % (ref 11.5–15.5)
WBC: 7 10*3/uL (ref 4.0–10.5)

## 2017-03-10 LAB — BASIC METABOLIC PANEL
ANION GAP: 6 (ref 5–15)
BUN: 8 mg/dL (ref 6–20)
CALCIUM: 8.8 mg/dL — AB (ref 8.9–10.3)
CO2: 26 mmol/L (ref 22–32)
Chloride: 106 mmol/L (ref 101–111)
Creatinine, Ser: 0.95 mg/dL (ref 0.61–1.24)
Glucose, Bld: 102 mg/dL — ABNORMAL HIGH (ref 65–99)
POTASSIUM: 3.4 mmol/L — AB (ref 3.5–5.1)
Sodium: 138 mmol/L (ref 135–145)

## 2017-03-10 LAB — LIPID PANEL
CHOL/HDL RATIO: 3.2 ratio
CHOLESTEROL: 171 mg/dL (ref 0–200)
HDL: 54 mg/dL (ref >40)
LDL Cholesterol: 66 mg/dL (ref 0–99)
TRIGLYCERIDES: 256 mg/dL — AB (ref <150)
VLDL: 51 mg/dL — AB (ref 0–40)

## 2017-03-10 LAB — CREATININE, SERUM
Creatinine, Ser: 1.09 mg/dL (ref 0.61–1.24)
GFR calc Af Amer: >60 mL/min (ref >60)

## 2017-03-10 LAB — RAPID URINE DRUG SCREEN, HOSP PERFORMED
Amphetamines: NOT DETECTED
BARBITURATES: NOT DETECTED
BENZODIAZEPINES: NOT DETECTED
COCAINE: NOT DETECTED
OPIATES: NOT DETECTED
TETRAHYDROCANNABINOL: NOT DETECTED

## 2017-03-10 LAB — VAS US CAROTID
LCCAPDIAS: 22 cm/s
LCCAPSYS: 111 cm/s
LEFT ECA DIAS: -8 cm/s
LEFT VERTEBRAL DIAS: -26 cm/s
LICAPDIAS: -27 cm/s
Left CCA dist dias: -12 cm/s
Left CCA dist sys: -51 cm/s
Left ICA dist dias: -30 cm/s
Left ICA dist sys: -78 cm/s
Left ICA prox sys: -63 cm/s
RCCADSYS: -86 cm/s
RCCAPDIAS: 17 cm/s
RCCAPSYS: 103 cm/s
RIGHT ECA DIAS: -10 cm/s
RIGHT VERTEBRAL DIAS: -10 cm/s

## 2017-03-10 LAB — HEMOGLOBIN A1C
Hgb A1c MFr Bld: 5.8 % — ABNORMAL HIGH (ref 4.8–5.6)
Mean Plasma Glucose: 119.76 mg/dL

## 2017-03-10 LAB — TSH: TSH: 0.982 u[IU]/mL (ref 0.350–4.500)

## 2017-03-10 LAB — ECHOCARDIOGRAM COMPLETE
Height: 72 in
Weight: 4560 oz

## 2017-03-10 LAB — HIV ANTIBODY (ROUTINE TESTING W REFLEX): HIV SCREEN 4TH GENERATION: NONREACTIVE

## 2017-03-10 MED ORDER — ACETAMINOPHEN 160 MG/5ML PO SOLN: 650.0000 mg | ORAL | Status: DC | PRN

## 2017-03-10 MED ORDER — ACETAMINOPHEN 325 MG PO TABS: 650.0000 mg | ORAL_TABLET | ORAL | Status: DC | PRN

## 2017-03-10 MED ORDER — NITROGLYCERIN 0.4 MG SL SUBL: 0.4000 mg | SUBLINGUAL_TABLET | SUBLINGUAL | Status: DC | PRN

## 2017-03-10 MED ORDER — HYDROCHLOROTHIAZIDE 12.5 MG PO CAPS
12.5000 mg | ORAL_CAPSULE | Freq: Every day | ORAL | Status: DC
  Administered 2017-03-10 – 2017-03-11 (×2): 12.5 mg via ORAL
  Filled 2017-03-10 (×2): qty 1

## 2017-03-10 MED ORDER — ASPIRIN 81 MG PO CHEW
81.0000 mg | CHEWABLE_TABLET | Freq: Every day | ORAL | Status: DC
  Administered 2017-03-11: 81 mg via ORAL
  Filled 2017-03-10: qty 1

## 2017-03-10 MED ORDER — SODIUM CHLORIDE 0.9 % IV SOLN
INTRAVENOUS | Status: DC
  Administered 2017-03-10: 04:00:00 via INTRAVENOUS

## 2017-03-10 MED ORDER — CARVEDILOL 3.125 MG PO TABS
3.1250 mg | ORAL_TABLET | Freq: Two times a day (BID) | ORAL | Status: DC
Start: 2017-03-10 — End: 2017-03-11
  Administered 2017-03-11: 3.125 mg via ORAL
  Filled 2017-03-10 (×4): qty 1

## 2017-03-10 MED ORDER — ENOXAPARIN SODIUM 40 MG/0.4ML ~~LOC~~ SOLN
40.0000 mg | SUBCUTANEOUS | Status: DC
  Administered 2017-03-10 – 2017-03-11 (×2): 40 mg via SUBCUTANEOUS
  Filled 2017-03-10 (×3): qty 0.4

## 2017-03-10 MED ORDER — LISINOPRIL 10 MG PO TABS
10.0000 mg | ORAL_TABLET | Freq: Every day | ORAL | Status: DC
  Administered 2017-03-10 – 2017-03-11 (×2): 10 mg via ORAL
  Filled 2017-03-10 (×2): qty 1

## 2017-03-10 MED ORDER — POTASSIUM CHLORIDE CRYS ER 20 MEQ PO TBCR
20.0000 meq | EXTENDED_RELEASE_TABLET | Freq: Every day | ORAL | Status: DC
  Administered 2017-03-10 – 2017-03-11 (×2): 20 meq via ORAL
  Filled 2017-03-10 (×2): qty 1

## 2017-03-10 MED ORDER — ACETAMINOPHEN 650 MG RE SUPP: 650.0000 mg | RECTAL | Status: DC | PRN

## 2017-03-10 MED ORDER — PANTOPRAZOLE SODIUM 40 MG PO TBEC
40.0000 mg | DELAYED_RELEASE_TABLET | Freq: Every day | ORAL | Status: DC
  Administered 2017-03-10 – 2017-03-11 (×2): 40 mg via ORAL
  Filled 2017-03-10 (×3): qty 1

## 2017-03-10 MED ORDER — SENNOSIDES-DOCUSATE SODIUM 8.6-50 MG PO TABS: 1.0000 | ORAL_TABLET | Freq: Every evening | ORAL | Status: DC | PRN

## 2017-03-10 MED ORDER — ASPIRIN 325 MG PO TABS
325.0000 mg | ORAL_TABLET | Freq: Once | ORAL | Status: AC
  Administered 2017-03-10: 325 mg via ORAL
  Filled 2017-03-10: qty 1

## 2017-03-10 MED ORDER — ATORVASTATIN CALCIUM 80 MG PO TABS
80.0000 mg | ORAL_TABLET | Freq: Every day | ORAL | Status: DC
  Administered 2017-03-10: 80 mg via ORAL
  Filled 2017-03-10 (×2): qty 1

## 2017-03-10 MED ORDER — TRAMADOL HCL 50 MG PO TABS
50.0000 mg | ORAL_TABLET | Freq: Four times a day (QID) | ORAL | Status: DC | PRN
  Administered 2017-03-10: 50 mg via ORAL
  Filled 2017-03-10 (×2): qty 1

## 2017-03-10 MED ORDER — STROKE: EARLY STAGES OF RECOVERY BOOK
Freq: Once | Status: DC
  Filled 2017-03-10: qty 1

## 2017-03-10 NOTE — ED Notes (Signed)
Called cafeteria to see why food has not arrived yet.

## 2017-03-10 NOTE — ED Notes (Signed)
Pt back from MRI.  Speaking with admitting MD.

## 2017-03-10 NOTE — Progress Notes (Signed)
**  Preliminary report by tech**  Carotid artery duplex complete. Findings are consistent with a 1-39 percent stenosis involving the right internal carotid artery and the left internal carotid artery. The vertebral arteries demonstrate antegrade flow.  03/10/17 12:18 PM Olen CordialGreg Marcine Gadway RVT

## 2017-03-10 NOTE — Progress Notes (Addendum)
STROKE TEAM PROGRESS NOTE   HISTORY OF PRESENT ILLNESS (per record) [name] is an 51 y.o. male African-American right-handed with past medical history of coronary artery disease s/p stent, hypertension, hyperlipidemia, tobacco abuse (quit in 2011, now uses occasional cigarettes).  The patient woke up 03/09/2017 feeling normal, and around 8:30 AM while at work he noticed a tingling sensation over his right arm which progressed to his leg. Approximately half an hour later patient felt that his right arm was weaker and noted his balance was off while walking. He denies any slurred speech, facial droop, visual symptoms. His coworkers suggested that he call EMS, however the patient felt he was fine and went to went to his PCP later in the day, who instructed the patient to come to the emergency room.   The patient continues to complain of paresthesias of his right arm and leg, and has right hemiparesis. CT head was performed and was normal. His blood pressure was normal at 120 systolic, however during his time in ER, he had an elevated BP of 160/100. He denies any shooting pain radiating from his neck down to his arm and leg.  Date last known well: 9.11.18 Time last known well: 8.30 am  NIHSS 3 MRS 0    Patient was not administered IV t-PA secondary to arriving outside of the tPA treatment window. He was admitted to General Neurology for further evaluation and treatment.   SUBJECTIVE (INTERVAL HISTORY) His wife is at the bedside.  Pt stated that this morning he woke up and all his symptoms resolved. He stated that he was working yesterday with heavy lifting, but had sudden onset right UE numbness with right toe tingling. Went to see PCP and told to close monitoring. At home he had left leg tingling which made them come to ER. He had some right shoulder pain lately and he had back pain sometime in the past needed PT.    OBJECTIVE Temp:  [97.5 F (36.4 C)-98.2 F (36.8 C)] 97.5 F (36.4 C)  (09/12 1001) Pulse Rate:  [51-68] 58 (09/12 1300) Resp:  [9-26] 13 (09/12 1300) BP: (115-157)/(58-114) 147/74 (09/12 1300) SpO2:  [86 %-100 %] 100 % (09/12 1300) Weight:  [129.3 kg (285 lb)] 129.3 kg (285 lb) (09/11 1623)  CBC:   Recent Labs Lab 03/09/17 1652 03/09/17 1703 03/10/17 0100  WBC 7.4  --  7.0  NEUTROABS 3.5  --   --   HGB 14.2 14.3 13.4  HCT 41.4 42.0 40.5  MCV 83.1  --  84.0  PLT 222  --  212    Basic Metabolic Panel:   Recent Labs Lab 03/09/17 1652 03/09/17 1703 03/10/17 0100 03/10/17 1013  NA 137 141  --  138  K 3.0* 3.5  --  3.4*  CL 104 100*  --  106  CO2 24  --   --  26  GLUCOSE 131* 129*  --  102*  BUN 10 14  --  8  CREATININE 0.99 1.00 1.09 0.95  CALCIUM 9.0  --   --  8.8*    Lipid Panel:     Component Value Date/Time   CHOL 171 03/10/2017 0713   TRIG 256 (H) 03/10/2017 0713   HDL 54 03/10/2017 0713   CHOLHDL 3.2 03/10/2017 0713   VLDL 51 (H) 03/10/2017 0713   LDLCALC 66 03/10/2017 0713   HgbA1c:  Lab Results  Component Value Date   HGBA1C 5.8 (H) 03/10/2017   Urine Drug Screen:  Component Value Date/Time   LABOPIA NONE DETECTED 03/09/2017 2315   COCAINSCRNUR NONE DETECTED 03/09/2017 2315   LABBENZ NONE DETECTED 03/09/2017 2315   AMPHETMU NONE DETECTED 03/09/2017 2315   THCU NONE DETECTED 03/09/2017 2315   LABBARB NONE DETECTED 03/09/2017 2315    Alcohol Level No results found for: ETH  IMAGING I have personally reviewed the radiological images below and agree with the radiology interpretations.  Ct Head Wo Contrast 03/09/2017 IMPRESSION: No acute intracranial abnormality noted.  Mr Brain 49Wo Contrast Mr Maxine GlennMra Head Wo Contrast 03/10/2017 IMPRESSION: 1. No acute intracranial abnormality. 2. 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 3. Patent circle of Willis. No large vessel occlusion or high-grade stenosis. 4. 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  Mr Cervical Spine Wo Contrast 03/10/2017 IMPRESSION: 1. Normal MR appearance of the cervical spinal cord. No cord lesions or syrinx. 2. Bulging discs at C3-4 and C4-5 with mild flattening of the ventral thecal sac and mild foraminal encroachment bilaterally. 3. No focal disc protrusions or significant foraminal stenosis.   TTE  03/10/2017 Study Conclusions - Left ventricle: The cavity size was normal. There was mild focal  basal hypertrophy of the septum. Systolic function was normal. The estimated ejection fraction was in the range of 60% to 65%.  Wall motion was normal; there were no regional wall motion abnormalities. Doppler parameters are consistent with abnormal left ventricular relaxation (grade 1 diastolic dysfunction). - Pulmonary arteries: Systolic pressure was mildly increased. PA peak pressure: 41 mm Hg (S). Impressions: - No cardiac source of emboli was indentified.  Carotid US 03/10/2017 Findings are consistent with a 1-39 percent stenosis involving the right internal carotid artery and the left internal carotid artery. The vertebral arteries demonstrate antegrade flow.  PHYSICAL EXAM  Temp:  [97.5 F (36.4 C)-97.7 F (36.5 C)] 97.5 F (36.4 C) (09/12 1001) Pulse Rate:  [51-68] 62 (09/12 1634) Resp:  [9-26] 18 (09/12 1634) BP: (115-157)/(58-114) 143/103 (09/12 1634) SpO2:  [86 %-100 %] 100 % (09/12 1634)  General - Well nourished, well developed, in no apparent distress.  Ophthalmologic - Sharp disc margins OU.   Cardiovascular - Regular rate and rhythm.  Mental Status -  Level of arousal and orientation to time, place, and person were intact. Language including expression, naming, repetition, comprehension was assessed and found intact. Attention span and concentration were normal. Recent and remote memory were intact Fund of Knowledge was assessed and was intact.  Cranial Nerves II - XII  - II - Visual field intact OU. III, IV, VI - Extraocular movements intact. V - Facial sensation intact bilaterally. VII - Facial movement intact bilaterally. VIII - Hearing & vestibular intact bilaterally. X - Palate elevates symmetrically. XI - Chin turning & shoulder shrug intact bilaterally. XII - Tongue protrusion intact.  Motor Strength - The patient's strength was normal in all extremities and pronator drift was absent.  Bulk was normal and fasciculations were absent.   Motor Tone - Muscle tone was assessed at the neck and appendages and was normal.  Reflexes - The patient's reflexes were 1+ in all extremities and he had no pathological reflexes.  Sensory - Light touch, temperature/pinprick, vibration and proprioception, and Romberg testing were assessed and were symmetrical.    Coordination - The patient had normal movements in the hands and feet with no ataxia or dysmetria.  Tremor was absent.  Gait and Station -  The patient's transfers, posture, gait, station, and turns were observed as normal.   ASSESSMENT/PLAN Mr. [name] is a 51 y.o. male with history ofcoronary artery disease s/p stent, hypertension, hyperlipidemia, tobacco abuse (quit in 2011, now uses occasional cigarettes). He did not receive IV t-PA due to arriving outside of the tPA treatment window.   Transient right hemiparesthesia - TIA vs. Radiculopathy (cervical and lumbar) - due to stroke risk factors (CAD s/p stent, HTN, HLD, OSA on CPAP), we have to put TIA on the top of DDx. However, his work involves heavy lifting and he has right shoulder pain and hx of back pain, radiculopathy still on the DDx. No hx of migraine, complicated migraine less likely.   Resultant  Symptoms resolved  CT head: no acute stroke  MRI head: no acute stroke  MRA head: no LVO or high-grade stenosis  Carotid Doppler: B ICA 1-39% stenosis, VAs antegrade  2D Echo: EF 60-65%. No source of embolus  UDS neg  LDL  66  HgbA1c 5.8  Lovenox 40 mg sq daily for VTE prophylaxis Diet NPO time specified  No antithrombotic prior to admission, now on aspirin 325 mg daily. Continue ASA  on discharge.  Patient counseled to be compliant with his antithrombotic medications  Ongoing aggressive stroke risk factor management  Therapy recommendations:  pending  Disposition:  pending  Hypertension  Stable  Permissive hypertension (OK if < 220/120) but gradually normalize in 5-7 days  Long-term BP goal normotensive  BP monitoring at home  Hyperlipidemia  Home meds: atorvastatin 80 mg PO daily, resumed in hospital  LDL 66, goal < 70  Continue statin at discharge  Other Stroke Risk Factors  ETOH use, advised to drink no more than 2 drink(s) a day  Obesity, Body mass index is 38.65 kg/m., recommend weight loss, diet and exercise as appropriate   Coronary artery disease s/p stent  Obstructive sleep apnea, on CPAP at home  Other Active Problems  LBP  Hospital day # 1  Neurology will sign off. Please call with questions. No neuro follow up needed at this time. Thanks for the consult.  Marvel Plan, MD PhD Stroke Neurology 03/10/2017 5:40 PM   To contact Stroke Continuity provider, please refer to [email or web address]. After hours, contact General Neurology

## 2017-03-10 NOTE — ED Notes (Addendum)
Patient transported to MRI 

## 2017-03-10 NOTE — Progress Notes (Signed)
  Echocardiogram 2D Echocardiogram has been performed.  Allen SavoyCasey N Dagmawi [name] 03/10/2017, 12:35 PM

## 2017-03-10 NOTE — Consult Note (Signed)
Requesting Physician: Ms. Merceda ElksHammond PA-C    Chief Complaint: Right-sided numbness and weakness  History obtained from:  Patient and Chart   HPI:                                                                                                                                       Manon HildingJeffery B Barrales is an 51 y.o. male African-American right-handed with past medical history of coronary artery disease s/p stent, hypertension, hyperlipidemia, tobacco abuse (quit in 2011, now uses occasional cigarettes).  The patient woke up today feeling normal and around 8:30 AM while at work he noticed a tingling sensation over his right arm which progress to his  leg. Approximately half an hour later patient felt that his right arm was weaker and will try to walk noticed he felt his balance was off. He denies any slurred speech, facial droop, visual symptoms. His coworkers suggested that he call EMS however the patient felt he was fine and went to went to his PCP later in the day who instructed the patient to come to the emergency room.   The patient continues to complain of paresthesias of his right arm and leg, and has right hemiparesis. CT head was performed and was normal. His blood pressure was normal at 120 systolic, however during his time in ER, he had an elevated BP of 160/100. He denies any shooting pain radiating from his neck down to his arm and leg.  Date last known well: 9.11.18 Time last known well: 8.30 am  tPA Given: No outside window  NIHSS 3 MRS 0         Past Medical History:  Diagnosis Date  . Coronary artery disease    NSTEMI 10/11 LHC showed 99% OM2, EF 65%. He had 2.25 X 15 promus DES to PDA. Echo (11/11) showed EF 60% mild to moderate LVH, moderate diastolic dysfunction;  ETT-myoview (10/12): 9' exercise, no ECG changes, EF 61%, normal perfusion images.   Lexiscan Myoview (10/14):  Low risk, no ischemia, EF 48%  . ED (erectile dysfunction)   . Ex-smoker   . GERD (gastroesophageal reflux  disease)   . Hx of echocardiogram    Echo (10/14):  Mod LVH, EF 55-60%  . Hyperlipidemia   . Hypertension   . MI (myocardial infarction) (HCC) 2011  . OSA (obstructive sleep apnea)    CPAP  . S/P appendectomy     Past Surgical History:  Procedure Laterality Date  . APPENDECTOMY    . CORONARY STENT PLACEMENT     x1    Family History  Problem Relation Age of Onset  . Coronary artery disease Unknown        grandfather  . Diabetes Unknown    Social History:  reports that he has quit smoking. He has never used smokeless tobacco. He reports that he drinks alcohol. He reports that he does not use drugs.  Allergies:  Allergies  Allergen Reactions  . Penicillins Anaphylaxis    Has patient had a PCN reaction causing immediate rash, facial/tongue/throat swelling, SOB or lightheadedness with hypotension: Yes Has patient had a PCN reaction causing severe rash involving mucus membranes or skin necrosis: Yes Has patient had a PCN reaction that required hospitalization: Yes Has patient had a PCN reaction occurring within the last 10 years: No If all of the above answers are "NO", then may proceed with Cephalosporin use.     Medications:                                                                                                                           Reviewed-not taking aspirin at home, compliant with blood pressure medication  ROS:                                                                                                                                         General ROS: negative for - chills, fatigue, fever, night sweats, weight gain or weight loss Psychological ROS: negative for - behavioral disorder, hallucinations, memory difficulties, mood swings or suicidal ideation Ophthalmic ROS: negative for - blurry vision, double vision, eye pain or loss of vision ENT ROS: negative for - epistaxis, nasal discharge, oral lesions, sore throat, tinnitus or vertigo Allergy and  Immunology ROS: negative for - hives or itchy/watery eyes Hematological and Lymphatic ROS: negative for - bleeding problems, bruising or swollen lymph nodes Endocrine ROS: negative for - galactorrhea, hair pattern changes, polydipsia/polyuria or temperature intolerance Respiratory ROS: negative for - cough, hemoptysis, shortness of breath or wheezing Cardiovascular ROS: negative for - chest pain, dyspnea on exertion, edema or irregular heartbeat Gastrointestinal ROS: negative for - abdominal pain, diarrhea, hematemesis, nausea/vomiting or stool incontinence Genito-Urinary ROS: negative for - dysuria, hematuria, incontinence or urinary frequency/urgency Musculoskeletal ROS: negative for - joint swelling or muscular weakness Neurological ROS: as noted in HPI Dermatological ROS: negative for rash and skin lesion changes   Examination:  General: Appears well-developed and well-nourished.  Psych: Affect appropriate to situation Eyes: No scleral injection HENT: No OP obstrucion Head: Normocephalic.  Cardiovascular: Normal rate and regular rhythm.  Respiratory: Effort normal and breath sounds normal to anterior ascultation GI: Soft.  No distension. There is no tenderness.  Skin: WDI   Neurological Examination Mental Status: Alert, oriented, thought content appropriate.  Speech fluent without evidence of aphasia.  Able to follow 3 step commands without difficulty. Cranial Nerves: II: Discs flat bilaterally; Visual fields grossly normal,  III,IV, VI: ptosis not present, extra-ocular motions intact bilaterally, pupils equal, round, reactive to light and accommodation V,VII: smile symmetric, facial light touch sensation normal bilaterally VIII: hearing normal bilaterally IX,X: uvula rises symmetrically XI: bilateral shoulder shrug XII: midline tongue extension Motor: Right : Upper extremity    4+/5    Left:     Upper extremity   4/5  Lower extremity   5/5     Lower extremity   5/5 Tone and bulk:normal tone throughout; no atrophy noted Sensory: Reduced sensation to pinprick and light touch, temperature over left upper extremity and left lower extremity. Patient also complains of paresthesias in both her upper and lower extremities. Facial sensation symmetric bilaterally. Deep Tendon Reflexes: 2+ and symmetric throughout Plantars: Right: downgoing   Left: downgoing Cerebellar: normal finger-to-nose, normal rapid alternating movements and normal heel-to-shin test Gait: normal gait and station     Lab Results: Basic Metabolic Panel:  Recent Labs Lab 03/09/17 1652 03/09/17 1703  NA 137 141  K 3.0* 3.5  CL 104 100*  CO2 24  --   GLUCOSE 131* 129*  BUN 10 14  CREATININE 0.99 1.00  CALCIUM 9.0  --     CBC:  Recent Labs Lab 03/09/17 1652 03/09/17 1703  WBC 7.4  --   NEUTROABS 3.5  --   HGB 14.2 14.3  HCT 41.4 42.0  MCV 83.1  --   PLT 222  --     Coagulation Studies:  Recent Labs  03/09/17 1652  LABPROT 13.6  INR 1.05    Imaging: Ct Head Wo Contrast  Result Date: 03/09/2017 CLINICAL DATA:  Right arm numbness for several hours EXAM: CT HEAD WITHOUT CONTRAST TECHNIQUE: Contiguous axial images were obtained from the base of the skull through the vertex without intravenous contrast. COMPARISON:  None. FINDINGS: Brain: No evidence of acute infarction, hemorrhage, hydrocephalus, extra-axial collection or mass lesion/mass effect. Vascular: No hyperdense vessel or unexpected calcification. Skull: Normal. Negative for fracture or focal lesion. Sinuses/Orbits: No acute finding. Other: None. IMPRESSION: No acute intracranial abnormality noted. Electronically Signed   By: Alcide Clever M.D.   On: 03/09/2017 18:38     ASSESSMENT AND PLAN  51 year old male with past medical history of coronary artery disease, hypertension, hyperlipidemia and prediabetes, past tobacco  abuse presents with sudden onset right-sided paresthesias and weakness. CT head shows no evidence of hemorrhage. On examination, the patient has reduced sensation over the right arm and leg, right arm and leg paresthesias, mild right upper extremity ataxia, right pronator drift and right leg weakness. No facial involvement. Clinically high degree of suspicion for an acute ischemic stroke. Patient outside the window for TPA.   Probable Acute Ischemic Stroke  Recommend # MRI of the brain without contrast #MRA Head and neck  #Transthoracic Echo  # Start patient on ASA ,load with 600 mg Plavix today and continue  daily (initial dual antiplatelet therapy per results of POINT, CHANCE trials). Duration of dual antiplatelet treatment usually  3 weeks to 3 months and will decided by stroke team based on mechanism of stroke.  #Start or continue Atorvastatin 80 mg/other high intensity statin # BP goal: permissive HTN upto 210 systolic, PRNs above 21 # HBAIC and Lipid profile # Telemetry monitoring # Frequent neuro checks # Bedside swallow  Please page stroke NP  Or  PA  Or MD from 8am -4 pm  as this patient from this time will be  followed by the stroke.   You can look them up on [email or web address]  Password [facility]   Georgiana Spinner Aroor MD [facility] [phone]  If 7pm to 7am, please call on call as listed on AMION.

## 2017-03-10 NOTE — ED Notes (Signed)
Spoke with MD re daily meds. He will order daily meds.

## 2017-03-10 NOTE — Discharge Summary (Signed)
Family Medicine Teaching [facility] Discharge Summary  Patient name: [name] Medical record number: [id number] Date of birth: Apr 01, 1966 Age: 51 y.o. Gender: male Date of Admission: 03/09/2017  Date of Discharge: 03/11/2017 Admitting Physician: Tobey Grim, MD  Primary Care Provider: Smothers, Cathleen Corti, NP Consultants: Neurology  Indication for Hospitalization: Right hemiparesis/paresthesia/dizziness concerning for stroke/TIA  Discharge Diagnoses/Problem List:  Right hemiparesis/paresthesia/dizziness, resolved Hypertension, stable CAD s/p DES stent placement in 2011, stable OSA, stable HLD, stable H/o thyromegaly/thyroid nodule, stable Back pain, stable  Disposition: Home  Discharge Condition: Improved  Discharge Exam:  General: appears comfortable lying in bed, in NAD Cardiovascular: RRR, no murmurs/rubs/gallops Respiratory: CTA bilaterally, no wheezes/rales/rhonchi MSK:ROM grossly intact  Neuro:Alert and oriented, speech normal. Optic field normal. Extraocular movements intact. Intact symmetric sensation to light touch of face and extremities bilaterally. Hearing grossly intact bilaterally. Tongue protrudes normally with no deviation. Shoulder shrug, smile symmetric. Able to swallow. Finger to norse normal.  Brief Hospital Course:  51 yo male with PMH significant for CAD s/p stent placement in 2011, HTN, HLD, OSA, obesity presented with R sided weakness, tingling, and numbness that started 9/11 at work. No slurred speech, facial drooping noted. He was seen by his PCP 9/11 in the afternoon and was advised to go to the ED with concern for possible stroke. On arrival to the ED, he received a CT head that was negative for acute intracranial process and did not receive tPA as he presented outside the advised time frame.  He also was hypertensive to 164/106.  Further work up in the ED including CBC, PTT/PT/INR, troponin, EKG, UDS all within normal limits. CMP  notable for K 3.0. Exam in the ED remarkable for 4/5 motor strength is in the right arm and leg, right-sided paresthesia and mildright-sided pronator drift with NIHSS 3. Patient was admitted for stroke work up and Neurology was consulted. Hypertensive medications were held and permissive hypertension allowed during stroke work up. MRI/MRA of brain, cervical spine did not reveal stroke process (see detailed report below). ECHO showed no source of emboli and Carotid U/S showed small stenosis bilaterally (detailed reports below). Exam the following morning 09/12 revealed no neurological deficits and patient subjectively felt back to baseline. Was observed overnight with no neurological events noted and continued to have resolution of symptoms on 09/13. Blood pressure trended down to normal limits without intervention throughout hospitalization and some hypertensive medications were restarted once imaging resulted. His Norvasc was not restarted on discharge due to low normal BPs and should follow up outpatient regarding blood pressure management.   Issues for Follow Up:  1. Ensure continued resolution of symptoms 2. Ensure toleration and compliance of aspirin  as patient reported to have stopped taking it. 3. Follow up on BP management - consider restarting Norvasc if needed. 4. Continue encouraging tobacco cessation.  Significant Procedures: None  Significant Labs and Imaging:   Recent Labs Lab 03/09/17 1652 03/09/17 1703 03/10/17 0100  WBC 7.4  --  7.0  HGB 14.2 14.3 13.4  HCT 41.4 42.0 40.5  PLT 222  --  212    Recent Labs Lab 03/09/17 1652 03/09/17 1703 03/10/17 0100 03/10/17 1013  NA 137 141  --  138  K 3.0* 3.5  --  3.4*  CL 104 100*  --  106  CO2 24  --   --  26  GLUCOSE 131* 129*  --  102*  BUN 10 14  --  8  CREATININE 0.99 1.00 1.09 0.95  CALCIUM 9.0  --   --  8.8*  ALKPHOS 57  --   --   --   AST 25  --   --   --   ALT 27  --   --   --   ALBUMIN 4.4  --   --   --     TTE09/12 Study Conclusions - Left ventricle: The cavity size was normal. There was mild focal basal hypertrophy of the septum. Systolic function was normal.The estimated ejection fraction was in the range of 60% to 65%. Wall motion was normal; there were no regional wall motionabnormalities. Doppler parameters are consistent with abnormal left ventricular relaxation (grade 1 diastolic dysfunction). -Pulmonary arteries: Systolic pressure was mildly increased. PA peak pressure: 41 mm Hg (S). Impressions: - No cardiac source of emboli was indentified.  Carotid US, 09/12 Findings are consistent with a 1-39 percent stenosis involving the right internal carotid artery and the left internal carotid artery. The vertebral arteries demonstrate antegrade flow.  MRI Cervical Spine 09/12 IMPRESSION: 1. Normal MR appearance of the cervical spinal cord. No cord lesions or syrinx. 2. Bulging discs at C3-4 and C4-5 with mild flattening of the ventral thecal sac and mild foraminal encroachment bilaterally. 3. No focal disc protrusions or significant foraminal stenosis  MRI/MRA Head 09/12 IMPRESSION: 1. No acute intracranial abnormality. 2. 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 3. Patent circle of Willis. No large vessel occlusion or high-grade stenosis. 4. 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  CT Head 09/11 FINDINGS: Brain: No evidence of acute infarction, hemorrhage, hydrocephalus, extra-axial collection or mass lesion/mass effect. Vascular: No hyperdense vessel or unexpected calcification. Skull: Normal. Negative for fracture or focal lesion. Sinuses/Orbits: No acute finding. Other: None. IMPRESSION: No acute intracranial abnormality noted.  Results/Tests Pending at Time of Discharge: None  Discharge Medications:  Allergies as of  03/11/2017      Reactions   Penicillins Anaphylaxis   Has patient had a PCN reaction causing immediate rash, facial/tongue/throat swelling, SOB or lightheadedness with hypotension: Yes Has patient had a PCN reaction causing severe rash involving mucus membranes or skin necrosis: Yes Has patient had a PCN reaction that required hospitalization: Yes Has patient had a PCN reaction occurring within the last 10 years: No If all of the above answers are "NO", then may proceed with Cephalosporin use.      Medication List    STOP taking these medications   amLODipine 10 MG tablet Commonly known as:  NORVASC     TAKE these medications   aspirin 81 MG chewable tablet Chew 4 tablets (325 mg total) by mouth daily.   atorvastatin 80 MG tablet Commonly known as:  LIPITOR Take 80 mg by mouth at bedtime.   carvedilol 3.125 MG tablet Commonly known as:  COREG Take 1 tablet (3.125 mg total) by mouth 2 (two) times daily with a meal. What changed:  medication strength  how much to take   hydrochlorothiazide 12.5 MG capsule Commonly known as:  MICROZIDE Take 12.5 mg by mouth daily.   lisinopril 10 MG tablet Commonly known as:  PRINIVIL,ZESTRIL Take 10 mg by mouth daily.   nitroGLYCERIN 0.4 MG SL tablet Commonly known as:  NITROSTAT Place 0.4 mg under the tongue every 5 (five) minutes as needed for chest pain.   omeprazole 20 MG capsule Commonly known as:  PRILOSEC Take 20 mg by mouth 2 (two)  times daily.   potassium chloride 20 MEQ packet Commonly known as:  KLOR-CON Take 20 mEq by mouth at bedtime.   traMADol 50 MG tablet Commonly known as:  ULTRAM Take 2 tablets (100 mg total) by mouth 4 (four) times daily. What changed:  how much to take  when to take this  reasons to take this            Discharge Care Instructions        Start     Ordered   03/12/17 0000  aspirin 81 MG chewable tablet  Daily     03/11/17 1334   03/11/17 0000  carvedilol (COREG) 3.125 MG  tablet  2 times daily with meals     03/11/17 1334   03/11/17 0000  Increase activity slowly     03/11/17 1334   03/11/17 0000  Diet - low sodium heart healthy     03/11/17 1334   03/11/17 0000  Discharge instructions    Comments:  Follow up with your regular doctor in 1 week to recheck your blood pressure and go over your medicines.   03/11/17 1334      Discharge Instructions: Please refer to Patient Instructions section of EMR for full details.  Patient was counseled important signs and symptoms that should prompt return to medical care, changes in medications, dietary instructions, activity restrictions, and follow up appointments.   Follow-Up Appointments: Follow-up Information    Smothers, Cathleen Cortieborah N, NP. Schedule an appointment as soon as possible for a visit in 1 week(s).   Specialty:  Nurse Practitioner Contact information: [address]  [place] [address] [phone]           Ellwood DenseRumball, Alison, DO 03/11/2017, 2:00 PM PGY-1, [facility]

## 2017-03-10 NOTE — ED Notes (Signed)
Patient transported to MRI 

## 2017-03-10 NOTE — Progress Notes (Signed)
Family Medicine Teaching Service Daily Progress Note Intern Pager: [phone]  Patient name: [name] Medical record number: [id number] Date of birth: 04-03-1966 Age: 51 y.o. Gender: male  Primary Care Provider: Smothers, Cathleen Corti, NP Consultants: Neurology Code Status: FULL  Pt Overview and Major Events to Date:  09/11 - Admit for stroke w/u  Assessment and Plan: [name] is a 51 y.o. male presenting with right hemiparesis and right paresthesia concerning for stroke. PMH is significant for hypertension, CAD s/p DES stent in 2011, hyperlipidemia & OSA on CPAP at home.  Right hemiparesis/paresthesia/dizziness: concerning for stroke. Outside tPA window in ED. CT head without acute intracranial process. MRI/MRA head without intracranial abnormality but mild chronic microvascular changes and R vertebral artery aneurysm. MRI cervical spine no cord lesions or syrinx. Patient has significant risk factors for stroke including history of CAD, hypertension, hyperlipidemia and smoking (occasional). Risk stratification labs notable for TSH, Cr wnl. A1C 5.8. UDS neg. Exam in ED remarkable for 4/5 motor strength is in the right arm and leg, right-sided paresthesia and mild right-sided pronator drift with NIHSS 3. On exam this morning, patient reports resolution of symptoms.  With significant risk factors, pt report of not taking ASA lately and resolution of symptoms with no intracranial evidence of stroke, symptoms most likely due to TIA.  Will continue further stroke workup and follow neurology recommendations. - Appreciate neurology recommendation -Carotid doppler -Echo -Risk stratification labs (lipid panel, A1c and TSH) - Neurovascular check q2hrs - Cardiac monitor - Permissive hypertension - Continue home atorvastatin. - PT/OT/SLP - NS KVO - Fall precaution  Hypertension: Normotensive on arrival to ED with elevation up to 157/114 after  admission. BPs have since normalized since early this morning (9/12) without intervention. - Hold home blood pressure medications for permissive hypertension  CAD s/p DES stent placement in 2011. No cardiopulmonary symptoms. On Coreg, atorvastatin and when necessary nitroglycerin at home. He says he hasn't needed nitroglycerin in years.  - Nitroglycerin as needed - Continue atorvastatin and aspirin - Hold his Coreg for now. He is also bradycardic.  - Advised to avoid NSAID. Is on ibuprofen for his back pain at home.  OSA: On nightly CPAP at home - Continue nightly CPAP  Hyperlipidemia Lipid panel on admission TG 256, HDL 54, VLDL 51, LDL 66. - Continue atorvastatin  History of thyromegaly/thyroid nodule: A 3 mm lesion noted on his CT neck in 2014. Denies history of hypo-or hyperthyroidism. No abnormality noted on exam. He was told the nodule has resolved. TSH wnl 0.982 on admission.  Back pain: Chronic issue. Stable. -Tylenol and tramadol as needed - Avoid NSAID given history of CAD  Vertigo: Resolved. Reports history of this 2 years ago. Could be related to possible stroke. - Consider vestibular PT if further vertigo - CVA workup as above  FEN/GI: Heart Healthy diet, passed SLP PPx: Lovenox, ASA   Disposition: Home pending stroke work up  Subjective:  Pt feels more like himself today, denies weakness and tingling. Feels strength is back. Relieved no stroke seen on imaging.  Objective: Temp:  [97.7 F (36.5 C)-98.2 F (36.8 C)] 97.7 F (36.5 C) (09/11 2144) Pulse Rate:  [51-63] 55 (09/12 0000) Resp:  [12-23] 19 (09/12 0100) BP: (120-157)/(62-114) 122/62 (09/12 0100) SpO2:  [86 %-99 %] 98 % (09/12 0000) Weight:  [285 lb (129.3 kg)] 285 lb (129.3 kg) (09/11 1623)   Physical Exam: General: appears comfortable lying in bed, in NAD Cardiovascular: RRR, no murmurs/rubs/gallops Respiratory: CTA bilaterally,  no wheezes/rales/rhonchi Abdomen: soft, non tender to  palpation, NABS present MSK: ROM grossly intact, no LE edema, neg Homan's sign.  Neuro: Alert and oriented, speech normal.  Optic field normal. Extraocular movements intact.  Intact symmetric sensation to light touch of face and extremities bilaterally. Hearing grossly intact bilaterally.  Tongue protrudes normally with no deviation.  Shoulder shrug, smile symmetric. Able to swallow. Hand pronation and supination intact.  Laboratory:  Recent Labs Lab 03/09/17 1652 03/09/17 1703 03/10/17 0100  WBC 7.4  --  7.0  HGB 14.2 14.3 13.4  HCT 41.4 42.0 40.5  PLT 222  --  212    Recent Labs Lab 03/09/17 1652 03/09/17 1703 03/10/17 0100  NA 137 141  --   K 3.0* 3.5  --   CL 104 100*  --   CO2 24  --   --   BUN 10 14  --   CREATININE 0.99 1.00 1.09  CALCIUM 9.0  --   --   PROT 7.6  --   --   BILITOT 0.6  --   --   ALKPHOS 57  --   --   ALT 27  --   --   AST 25  --   --   GLUCOSE 131* 129*  --    A1C 5.8 TSH 0.982 Lipid panel - TG 256, VLDL 51, LDL 66, HDL 54  Imaging/Diagnostic Tests:  MRI Cervical Spine 09/12 IMPRESSION: 1. Normal MR appearance of the cervical spinal cord. No cord lesions or syrinx. 2. Bulging discs at C3-4 and C4-5 with mild flattening of the ventral thecal sac and mild foraminal encroachment bilaterally. 3. No focal disc protrusions or significant foraminal stenosis  MRI/MRA Head 09/12 IMPRESSION: 1. No acute intracranial abnormality. 2. 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 3. Patent circle of Willis. No large vessel occlusion or high-grade stenosis. 4. 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  CT Head 09/11 FINDINGS: Brain: No evidence of acute infarction, hemorrhage, hydrocephalus, extra-axial collection or mass lesion/mass effect. Vascular: No hyperdense vessel or unexpected calcification. Skull:  Normal. Negative for fracture or focal lesion. Sinuses/Orbits: No acute finding. Other: None. IMPRESSION: No acute intracranial abnormality noted.  Ellwood DenseRumball, Timara Loma, DO 03/10/2017, 7:30 AM PGY-1, [facility] FPT[facility] Intern pager: [phone], text pages welcome

## 2017-03-11 DIAGNOSIS — G4733 Obstructive sleep apnea (adult) (pediatric): Secondary | ICD-10-CM | POA: Diagnosis not present

## 2017-03-11 DIAGNOSIS — Z9989 Dependence on other enabling machines and devices: Secondary | ICD-10-CM | POA: Diagnosis not present

## 2017-03-11 DIAGNOSIS — R531 Weakness: Secondary | ICD-10-CM

## 2017-03-11 DIAGNOSIS — G8191 Hemiplegia, unspecified affecting right dominant side: Secondary | ICD-10-CM | POA: Diagnosis not present

## 2017-03-11 DIAGNOSIS — I2510 Atherosclerotic heart disease of native coronary artery without angina pectoris: Secondary | ICD-10-CM | POA: Diagnosis not present

## 2017-03-11 MED ORDER — ASPIRIN 81 MG PO CHEW: 325.0000 mg | CHEWABLE_TABLET | Freq: Every day | ORAL | 1 refills | Status: DC

## 2017-03-11 MED ORDER — CARVEDILOL 3.125 MG PO TABS: 3.1250 mg | ORAL_TABLET | Freq: Two times a day (BID) | ORAL | 0 refills | Status: DC

## 2017-03-11 NOTE — Progress Notes (Signed)
Patient discharged to home, AVS reviewed, IV removed, telebox returned 

## 2017-03-11 NOTE — Discharge Instructions (Signed)
You were admitted for concern for stroke - after normal head imaging and resolution of symptoms it was determined with Neurology in consult that you had a TIA (transient ischemic attack). It is important that you take aspirin every day to prevent these symptoms from recurring as well as prevent future heart attack and stroke.  Follow up with your primary doctor in 1 week.

## 2017-03-11 NOTE — Progress Notes (Signed)
Family Medicine Teaching Service Daily Progress Note Intern Pager: [phone]  Patient name: [name] Medical record number: [id number] Date of birth: 25-Jul-1965 Age: 51 y.o. Gender: male  Primary Care Provider: Smothers, Cathleen Corti, NP Consultants: Neurology Code Status: FULL  Pt Overview and Major Events to Date:  09/11 - Admit for stroke w/u  Assessment and Plan: [name] is a 51 y.o. male presenting with right hemiparesis and right paresthesia concerning for stroke. PMH is significant for hypertension, CAD s/p DES stent in 2011, hyperlipidemia & OSA on CPAP at home.  Right hemiparesis/paresthesia/dizziness: concerning for stroke. Outside tPA window in ED. CT head without acute intracranial process. MRI/MRA head without intracranial abnormality but mild chronic microvascular changes and R vertebral artery aneurysm. MRI cervical spine no cord lesions or syrinx. Patient has significant risk factors for stroke including history of CAD, hypertension, hyperlipidemia and smoking (occasional). Risk stratification labs notable for TSH, Cr wnl. A1C 5.8. UDS neg. Exam in ED remarkable for 4/5 motor strength is in the right arm and leg, right-sided paresthesia and mild right-sided pronator drift with NIHSS 3. Patient's symptoms resolved 9/12 morning. With significant risk factors, pt report of not taking ASA lately and resolution of symptoms with no intracranial evidence of stroke, symptoms most likely due to TIA.  Observed overnight. PT/OT evaluated with no needs identified. If symptoms continued to resolve, can go home today.  - Appreciate neurology recommendation - Neurovascular check q2hrs - Cardiac monitor - Permissive hypertension - Continue home atorvastatin. - NS KVO - Fall precaution  Hypertension: Normotensive on arrival to ED with elevation up to 157/114 after admission. BPs have since normalized on 9/12 without intervention. Have restarted home medication after normal  imaging. - Continued home meds  CAD s/p DES stent placement in 2011. No cardiopulmonary symptoms. On Coreg, atorvastatin and when necessary nitroglycerin at home. He says he hasn't needed nitroglycerin in years.  - Nitroglycerin as needed - Continue atorvastatin and aspirin - Advised to avoid NSAID. Is on ibuprofen for his back pain at home.  OSA: On nightly CPAP at home - Continue nightly CPAP  Hyperlipidemia Lipid panel on admission TG 256, HDL 54, VLDL 51, LDL 66. - Continue atorvastatin  History of thyromegaly/thyroid nodule: A 3 mm lesion noted on his CT neck in 2014. Denies history of hypo-or hyperthyroidism. No abnormality noted on exam. He was told the nodule has resolved. TSH wnl 0.982 on admission.  Back pain: Chronic issue. Stable. -Tylenol and tramadol as needed - Avoid NSAID given history of CAD  Vertigo: Resolved. Reports history of this 2 years ago. Reported no symptoms today.  FEN/GI: Heart Healthy diet, passed SLP PPx: Lovenox, ASA   Disposition: Home today pending continued symptom resolution  Subjective:  Pt feels more like himself today not 100% but better after having CPAP last night, denies weakness and tingling.   Objective: Temp:  [97.5 F (36.4 C)-98.4 F (36.9 C)] 97.9 F (36.6 C) (09/13 0526) Pulse Rate:  [53-68] 53 (09/13 0526) Resp:  [9-24] 18 (09/13 0526) BP: (113-164)/(60-106) 131/79 (09/13 0526) SpO2:  [98 %-100 %] 99 % (09/13 0526) Weight:  [273 lb 6.4 oz (124 kg)] 273 lb 6.4 oz (124 kg) (09/12 2137)   Physical Exam: General: appears comfortable lying in bed, in NAD Cardiovascular: RRR, no murmurs/rubs/gallops Respiratory: CTA bilaterally, no wheezes/rales/rhonchi MSK: ROM grossly intact  Neuro: Alert and oriented, speech normal.  Optic field normal. Extraocular movements intact.  Intact symmetric sensation to light touch of face and  extremities bilaterally. Hearing grossly intact bilaterally.  Tongue protrudes normally with no  deviation.  Shoulder shrug, smile symmetric. Able to swallow. Finger to norse normal.  Laboratory:  Recent Labs Lab 03/09/17 1652 03/09/17 1703 03/10/17 0100  WBC 7.4  --  7.0  HGB 14.2 14.3 13.4  HCT 41.4 42.0 40.5  PLT 222  --  212    Recent Labs Lab 03/09/17 1652 03/09/17 1703 03/10/17 0100 03/10/17 1013  NA 137 141  --  138  K 3.0* 3.5  --  3.4*  CL 104 100*  --  106  CO2 24  --   --  26  BUN 10 14  --  8  CREATININE 0.99 1.00 1.09 0.95  CALCIUM 9.0  --   --  8.8*  PROT 7.6  --   --   --   BILITOT 0.6  --   --   --   ALKPHOS 57  --   --   --   ALT 27  --   --   --   AST 25  --   --   --   GLUCOSE 131* 129*  --  102*   A1C 5.8 TSH 0.982 Lipid panel - TG 256, VLDL 51, LDL 66, HDL 54  Imaging/Diagnostic Tests:  TTE 09/12 Study Conclusions - Left ventricle: The cavity size was normal. There was mild focal basal hypertrophy of the septum. Systolic function was normal.The estimated ejection fraction was in the range of 60% to 65%.  Wall motion was normal; there were no regional wall motionabnormalities. Doppler parameters are consistent with abnormal left ventricular relaxation (grade 1 diastolic dysfunction). - Pulmonary arteries: Systolic pressure was mildly increased. PA peak pressure: 41 mm Hg (S). Impressions: - No cardiac source of emboli was indentified.  Carotid US, 09/12 Findings are consistent with a 1-39 percent stenosis involving the right internal carotid artery and the left internal carotid artery. The vertebral arteries demonstrate antegrade flow.  MRI Cervical Spine 09/12 IMPRESSION: 1. Normal MR appearance of the cervical spinal cord. No cord lesions or syrinx. 2. Bulging discs at C3-4 and C4-5 with mild flattening of the ventral thecal sac and mild foraminal encroachment bilaterally. 3. No focal disc protrusions or significant foraminal stenosis  MRI/MRA Head 09/12 IMPRESSION: 1. No acute intracranial abnormality. 2. 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 3. Patent circle of Willis. No large vessel occlusion or high-grade stenosis. 4. 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  CT Head 09/11 FINDINGS: Brain: No evidence of acute infarction, hemorrhage, hydrocephalus, extra-axial collection or mass lesion/mass effect. Vascular: No hyperdense vessel or unexpected calcification. Skull: Normal. Negative for fracture or focal lesion. Sinuses/Orbits: No acute finding. Other: None. IMPRESSION: No acute intracranial abnormality noted.  Ellwood Denseumball, Merwyn Hodapp, DO 03/11/2017, 7:23 AM PGY-1, [facility] FPTS Intern pager: [phone], text pages welcome

## 2017-03-11 NOTE — Progress Notes (Addendum)
SLP Cancellation Note  Patient Details Name: [name] MRN: [id number] DOB: [birth year]   Cancelled treatment:       Reason Eval/Treat Not Completed: SLP screened, no needs identified, will sign off; MRI head 03/10/17 and CT head negative for acute changes; right hemiparesis resolved at this time.   Tressie StalkerPat Mckaela Howley, M.S., CCC-SLP 03/11/2017, 2:14 PM

## 2017-03-11 NOTE — Evaluation (Signed)
Physical Therapy Evaluation/ Discharge Patient Details Name: [name] MRN: [id number] DOB: 04/08/1966 Today's Date: 03/11/2017   History of Present Illness  51 yo M with sudden onset of hemiparesis and Right sided paresthesias, MRI negative. PMH is significant for hypertension, CAD   Clinical Impression  Pt very pleasant and moving well. Pt with equal and normal light touch all extremities, all extremities 5/5 for each myotome, no LOB/dizziness. Pt currently at baseline functional status and no further needs at this time. Will sign off with pt aware and agreeable. Pt educated for BE FAST should he need to react in the future to stroke symptoms.     Follow Up Recommendations No PT follow up    Equipment Recommendations  None recommended by PT    Recommendations for Other Services       Precautions / Restrictions Precautions Precautions: None      Mobility  Bed Mobility               General bed mobility comments: in chair on arrival  Transfers Overall transfer level: Independent                  Ambulation/Gait Ambulation/Gait assistance: Independent Ambulation Distance (Feet): 800 Feet Assistive device: None Gait Pattern/deviations: WFL(Within Functional Limits)   Gait velocity interpretation: at or above normal speed for age/gender General Gait Details: pt with normal gait able to change speed, direction and perform head turns without difficulty  Stairs Stairs: Yes Stairs assistance: Modified independent (Device/Increase time) Stair Management: One rail Right;Alternating pattern;Forwards Number of Stairs: 11    Wheelchair Mobility    Modified Rankin (Stroke Patients Only) Modified Rankin (Stroke Patients Only) Pre-Morbid Rankin Score: No symptoms Modified Rankin: No symptoms     Balance Overall balance assessment: No apparent balance deficits (not formally assessed)                                            Pertinent Vitals/Pain Pain Assessment: No/denies pain    Home Living Family/patient expects to be discharged to:: Private residence Living Arrangements: Spouse/significant other Available Help at Discharge: Family;Available PRN/intermittently Type of Home: House Home Access: Stairs to enter Entrance Stairs-Rails: Right Entrance Stairs-Number of Steps: 3 Home Layout: One level Home Equipment: None 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    Prior Function Level of Independence: Independent               Hand Dominance   Dominant Hand: Right    Extremity/Trunk Assessment   Upper Extremity Assessment Upper Extremity Assessment: Overall WFL for tasks assessed    Lower Extremity Assessment Lower Extremity Assessment: Overall WFL for tasks assessed    Cervical / Trunk Assessment Cervical / Trunk Assessment: Normal  Communication   Communication: No difficulties  Cognition Arousal/Alertness: Awake/alert Behavior During Therapy: WFL for tasks assessed/performed Overall Cognitive Status: Within Functional Limits for tasks assessed                                        General Comments      Exercises     Assessment/Plan    PT Assessment Patent does not need any further PT services  PT Problem List         PT Treatment Interventions  PT Goals (Current goals can be found in the Care Plan section)  Acute Rehab PT Goals PT Goal Formulation: All assessment and education complete, DC therapy    Frequency     Barriers to discharge        Co-evaluation               AM-PAC PT "6 Clicks" Daily Activity  Outcome Measure Difficulty turning over in bed (including adjusting bedclothes, sheets and blankets)?: None Difficulty moving from lying on back to sitting on the side of the bed? : None Difficulty sitting down on and standing up from a chair with arms (e.g., wheelchair, bedside commode, etc,.)?:  None Help needed moving to and from a bed to chair (including a wheelchair)?: None Help needed walking in hospital room?: None Help needed climbing 3-5 steps with a railing? : None 6 Click Score: 24    End of Session   Activity Tolerance: Patient tolerated treatment well Patient left: in chair;with call bell/phone within reach Nurse Communication: Mobility status PT Visit Diagnosis: Other abnormalities of gait and mobility (R26.89)    Time: 1610-9604 PT Time Calculation (min) (ACUTE ONLY): 17 min   Charges:   PT Evaluation $PT Eval Low Complexity: 1 Low     PT G Codes:   PT G-Codes **NOT FOR INPATIENT CLASS** Functional Assessment Tool Used: AM-PAC 6 Clicks Basic Mobility Functional Limitation: Mobility: Walking and moving around Mobility: Walking and Moving Around Current Status (V4098): 0 percent impaired, limited or restricted Mobility: Walking and Moving Around Goal Status (J1914): 0 percent impaired, limited or restricted Mobility: Walking and Moving Around Discharge Status (N8295): 0 percent impaired, limited or restricted    Delaney Meigs, PT [phone]   Roderick Sweezy B Tobias Avitabile 03/11/2017, 8:21 AM

## 2017-03-11 NOTE — Progress Notes (Signed)
OT Cancellation Note  Patient Details Name: Manon HildingJeffery B Anacker MRN: [id number] DOB: March 07, 1966   Cancelled Treatment:    Reason Eval/Treat Not Completed: After consulting with PT, OT screened, no needs identified, will sign off.  Evern BioLaura J Roan Miklos 03/11/2017, 8:25 AM  Sherryl MangesLaura Floyce Bujak OTR/L [phone]

## 2018-11-19 ENCOUNTER — Other Ambulatory Visit: Payer: Self-pay

## 2018-11-19 ENCOUNTER — Encounter (HOSPITAL_COMMUNITY): Payer: Self-pay

## 2018-11-19 ENCOUNTER — Emergency Department (HOSPITAL_COMMUNITY)
Admission: EM | Admit: 2018-11-19 | Discharge: 2018-11-19 | Disposition: A | Discharge status: Home / Self Care | Payer: BLUE CROSS/BLUE SHIELD | Attending: Emergency Medicine | Admitting: Emergency Medicine

## 2018-11-19 ENCOUNTER — Emergency Department (HOSPITAL_COMMUNITY): Payer: BLUE CROSS/BLUE SHIELD

## 2018-11-19 DIAGNOSIS — I2510 Atherosclerotic heart disease of native coronary artery without angina pectoris: Secondary | ICD-10-CM | POA: Diagnosis not present

## 2018-11-19 DIAGNOSIS — Z87891 Personal history of nicotine dependence: Secondary | ICD-10-CM | POA: Insufficient documentation

## 2018-11-19 DIAGNOSIS — R079 Chest pain, unspecified: Secondary | ICD-10-CM | POA: Insufficient documentation

## 2018-11-19 DIAGNOSIS — I10 Essential (primary) hypertension: Secondary | ICD-10-CM | POA: Insufficient documentation

## 2018-11-19 LAB — CBC
HCT: 43.3 % (ref 39.0–52.0)
Hemoglobin: 14.6 g/dL (ref 13.0–17.0)
MCH: 28.2 pg (ref 26.0–34.0)
MCHC: 33.7 g/dL (ref 30.0–36.0)
MCV: 83.8 fL (ref 80.0–100.0)
Platelets: 248 10*3/uL (ref 150–400)
RBC: 5.17 MIL/uL (ref 4.22–5.81)
RDW: 13 % (ref 11.5–15.5)
WBC: 6.6 10*3/uL (ref 4.0–10.5)
nRBC: 0 % (ref 0.0–0.2)

## 2018-11-19 LAB — TROPONIN I
Troponin I: <0.03 ng/mL (ref <0.03)
Troponin I: <0.03 ng/mL (ref <0.03)

## 2018-11-19 LAB — BASIC METABOLIC PANEL
Anion gap: 11 (ref 5–15)
BUN: 9 mg/dL (ref 6–20)
CO2: 26 mmol/L (ref 22–32)
Calcium: 9.2 mg/dL (ref 8.9–10.3)
Chloride: 101 mmol/L (ref 98–111)
Creatinine, Ser: 1.07 mg/dL (ref 0.61–1.24)
GFR calc Af Amer: >60 mL/min (ref >60)
GFR calc non Af Amer: >60 mL/min (ref >60)
Glucose, Bld: 100 mg/dL — ABNORMAL HIGH (ref 70–99)
Potassium: 3.5 mmol/L (ref 3.5–5.1)
Sodium: 138 mmol/L (ref 135–145)

## 2018-11-19 MED ORDER — ASPIRIN 81 MG PO CHEW
243.0000 mg | CHEWABLE_TABLET | Freq: Once | ORAL | Status: AC
  Administered 2018-11-19: 15:00:00 243 mg via ORAL
  Filled 2018-11-19: qty 3

## 2018-11-19 NOTE — Discharge Instructions (Addendum)
You were evaluated in the Emergency Department and after careful evaluation, we did not find any emergent condition requiring admission or further testing in the hospital.  Your testing today was reassuring with no evidence of heart damage.  As discussed, please follow-up with your regular doctors to discuss outpatient stress testing.  Please return to the Emergency Department if you experience any worsening of your condition.  We encourage you to follow up with a primary care provider.  Thank you for allowing [place] to be a part of your care.

## 2018-11-19 NOTE — ED Triage Notes (Signed)
Chest pain began a couple days ago, with numbness in tingling on left side and possibly involves his left arm.  Had an MI 2012

## 2018-11-19 NOTE — ED Provider Notes (Signed)
[facility] Emergency Department Provider Note MRN:  [id number]  Arrival date & time: 11/19/18     Chief Complaint   Chest Pain   History of Present Illness   [name] is a 53 y.o. year-old male with a history of CAD, MI presenting to the ED with chief complaint of chest pain.  Intermittent chest pain for the past 2 days.  Described as a tingling sensation on the left lateral side of the chest.  Described as a "funny bone sensation".  Also feels it in the arm, and feels that the pain is sometimes flared with arm movement.  Denies central chest pain or pressure, denies dizziness or diaphoresis, no nausea, no vomiting, shortness of breath.  Here because wife heard about his symptoms and given his history of heart attack told him to come to the emergency department.  Denies leg pain or swelling, no abdominal pain, no fever, no cough, no other symptoms.  Denies exertional worsening, no other exacerbating relieving factors.  Review of Systems  A complete 10 system review of systems was obtained and all systems are negative except as noted in the HPI and PMH.   Patient's Health History    Past Medical History:  Diagnosis Date  . Coronary artery disease    NSTEMI 10/11 LHC showed 99% OM2, EF 65%. He had 2.25 X 15 promus DES to PDA. Echo (11/11) showed EF 60% mild to moderate LVH, moderate diastolic dysfunction;  ETT-myoview (10/12): 9' exercise, no ECG changes, EF 61%, normal perfusion images.   Lexiscan Myoview (10/14):  Low risk, no ischemia, EF 48%  . ED (erectile dysfunction)   . Ex-smoker   . GERD (gastroesophageal reflux disease)   . Hx of echocardiogram    Echo (10/14):  Mod LVH, EF 55-60%  . Hyperlipidemia   . Hypertension   . MI (myocardial infarction) (HCC) 2011  . OSA (obstructive sleep apnea)    CPAP  . S/P appendectomy     Past Surgical History:  Procedure Laterality Date  . APPENDECTOMY    . CORONARY STENT PLACEMENT     x1    Family History   Problem Relation Age of Onset  . Coronary artery disease Unknown        grandfather  . Diabetes Unknown     Social History   Socioeconomic History  . Marital status: Married    Spouse name: Not on file  . Number of children: Not on file  . Years of education: Not on file  . Highest education level: Not on file  Occupational History  . Not on file  Social Needs  . Financial resource strain: Not on file  . Food insecurity:    Worry: Not on file    Inability: Not on file  . Transportation needs:    Medical: Not on file    Non-medical: Not on file  Tobacco Use  . Smoking status: Former Games developer  . Smokeless tobacco: Never Used  Substance and Sexual Activity  . Alcohol use: Yes    Comment: occ  . Drug use: No  . Sexual activity: Not on file  Lifestyle  . Physical activity:    Days per week: Not on file    Minutes per session: Not on file  . Stress: Not on file  Relationships  . Social connections:    Talks on phone: Not on file    Gets together: Not on file    Attends religious service: Not on file  Active member of club or organization: Not on file    Attends meetings of clubs or organizations: Not on file    Relationship status: Not on file  . Intimate partner violence:    Fear of current or ex partner: Not on file    Emotionally abused: Not on file    Physically abused: Not on file    Forced sexual activity: Not on file  Other Topics Concern  . Not on file  Social History Narrative  . Not on file     Physical Exam  Vital Signs and Nursing Notes reviewed Vitals:   11/19/18 1800 11/19/18 1815  BP: 119/74 125/75  Pulse: 62 61  Resp: (!) 24 (!) 27  Temp:    SpO2: 99% 98%    CONSTITUTIONAL: Well-appearing, NAD NEURO:  Alert and oriented x 3, no focal deficits EYES:  eyes equal and reactive ENT/NECK:  no LAD, no JVD CARDIO: Regular rate, well-perfused, normal S1 and S2 PULM:  CTAB no wheezing or rhonchi GI/GU:  normal bowel sounds, non-distended,  non-tender MSK/SPINE:  No gross deformities, no edema SKIN:  no rash, atraumatic PSYCH:  Appropriate speech and behavior  Diagnostic and Interventional Summary    EKG Interpretation  Date/Time:  Saturday Nov 19 2018 14:05:48 EDT Ventricular Rate:  70 PR Interval:    QRS Duration: 95 QT Interval:  397 QTC Calculation: 429 R Axis:   41 Text Interpretation:  Sinus rhythm RSR' in V1 or V2, right VCD or RVH Confirmed by Kennis Carina [phone]) on 11/19/2018 2:19:07 PM      Labs Reviewed  BASIC METABOLIC PANEL - Abnormal; Notable for the following components:      Result Value   Glucose, Bld 100 (*)    All other components within normal limits  CBC  TROPONIN I  TROPONIN I    DG Chest Port 1 View  Final Result      Medications  aspirin chewable tablet 243 mg (243 mg Oral Given 11/19/18 1433)     Procedures Critical Care  ED Course and Medical Decision Making  I have reviewed the triage vital signs and the nursing notes.  Pertinent labs & imaging results that were available during my care of the patient were reviewed by me and considered in my medical decision making (see below for details).  Atypical presentation of abnormal feeling in the chest, 53 year old male does have a history of MI, has not had a stress test in a few years.  Discussed options with patient, shared decision making with regard to admission for chest pain rule out or 2 troponins and close cardiology follow-up.  Patient would rather not be admitted, his heart score is 3, troponins pending.  Anticipating discharge.  Troponin negative x2, patient is appropriate for discharge with close PCP follow-up to discuss need for outpatient stress testing.  After the discussed management above, the patient was determined to be safe for discharge.  The patient was in agreement with this plan and all questions regarding their care were answered.  ED return precautions were discussed and the patient will return to the ED with  any significant worsening of condition.  Elmer Sow. Pilar Plate, MD [facility] Emergency Medicine [facility] [email or web address]  Final Clinical Impressions(s) / ED Diagnoses     ICD-10-CM   1. Chest pain R07.9 DG Chest [facility] 1 View    DG Chest Port 1 View    ED Discharge Orders    None  Sabas SousBero,  M, MD 11/19/18 [place] [id number]

## 2018-11-29 ENCOUNTER — Telehealth: Payer: Self-pay | Admitting: Cardiology

## 2018-11-30 NOTE — Telephone Encounter (Signed)
Called x3 for pre reg/LVM °

## 2018-11-30 NOTE — Telephone Encounter (Signed)
Patient returned call/smartphone/ consent/ my chart/ pre reg completed °

## 2018-12-02 ENCOUNTER — Telehealth (INDEPENDENT_AMBULATORY_CARE_PROVIDER_SITE_OTHER): Payer: BC Managed Care – PPO | Admitting: Cardiology

## 2018-12-02 ENCOUNTER — Encounter: Payer: Self-pay | Admitting: Cardiology

## 2018-12-02 VITALS — BP 128/84 | Temp 97.7°F | Ht 72.0 in | Wt 308.0 lb

## 2018-12-02 DIAGNOSIS — I10 Essential (primary) hypertension: Secondary | ICD-10-CM

## 2018-12-02 DIAGNOSIS — R072 Precordial pain: Secondary | ICD-10-CM | POA: Diagnosis not present

## 2018-12-02 DIAGNOSIS — E785 Hyperlipidemia, unspecified: Secondary | ICD-10-CM | POA: Diagnosis not present

## 2018-12-02 DIAGNOSIS — I25119 Atherosclerotic heart disease of native coronary artery with unspecified angina pectoris: Secondary | ICD-10-CM | POA: Diagnosis not present

## 2018-12-02 DIAGNOSIS — Z7189 Other specified counseling: Secondary | ICD-10-CM

## 2018-12-02 MED ORDER — ASPIRIN 81 MG PO CHEW: 81.0000 mg | CHEWABLE_TABLET | Freq: Every day | ORAL | Status: AC

## 2018-12-02 NOTE — Progress Notes (Signed)
Virtual Visit via Video Note   This visit type was conducted due to national recommendations for restrictions regarding the COVID-19 Pandemic (e.g. social distancing) in an effort to limit this patient's exposure and mitigate transmission in our community.  Due to his co-morbid illnesses, this patient is at least at moderate risk for complications without adequate follow up.  This format is felt to be most appropriate for this patient at this time.  All issues noted in this document were discussed and addressed.  A limited physical exam was performed with this format.  Please refer to the patient's chart for his consent to telehealth for [facility].   Date:  12/02/2018   ID:  [name], DOB [birth year], MRN [id number]  Patient Location: Home Provider Location: Home  PCP:  Verlon Au, MD  Cardiologist:  No primary care provider on file. (last seen by Dr. Shirlee Latch 04/2016) Electrophysiologist:  None   Evaluation Performed:  Follow-Up Visit  Chief Complaint:  Re-establish care, chest pain  History of Present Illness:    [name] is a 53 y.o. male with CAD s/p NSTEMI 2011 (DES to PDA), HTN, HLD, OSA on CPAP, possible TIA 2018 who presents to re-establish care.   The patient does not have symptoms concerning for COVID-19 infection (fever, chills, cough, or new shortness of breath).   Today: He was seen in the ER for chest pain on 11/19/18. Described as a tingling, left lateral chest. Troponin negative x2, nonischemic ECG. Describes it today like when you hit your funny bone. Has been on and off for the last few weeks, daily but unpredictable. Happens first thing in the morning sometimes, but otherwise upredictable. Has been doing yard work.   Denies shortness of breath at rest or with normal exertion. No PND, orthopnea, LE edema or unexpected weight gain. No syncope or palpitations.  Has bone on bone arthritis, does not think he can walk on treadmill. Reviewed  recommendations re: NSAIDs and CAD.   Past Medical History:  Diagnosis Date  . Coronary artery disease    NSTEMI 10/11 LHC showed 99% OM2, EF 65%. He had 2.25 X 15 promus DES to PDA. Echo (11/11) showed EF 60% mild to moderate LVH, moderate diastolic dysfunction;  ETT-myoview (10/12): 9' exercise, no ECG changes, EF 61%, normal perfusion images.   Lexiscan Myoview (10/14):  Low risk, no ischemia, EF 48%  . ED (erectile dysfunction)   . Ex-smoker   . GERD (gastroesophageal reflux disease)   . Hx of echocardiogram    Echo (10/14):  Mod LVH, EF 55-60%  . Hyperlipidemia   . Hypertension   . MI (myocardial infarction) (HCC) 2011  . OSA (obstructive sleep apnea)    CPAP  . S/P appendectomy    Past Surgical History:  Procedure Laterality Date  . APPENDECTOMY    . CORONARY STENT PLACEMENT     x1     Current Meds  Medication Sig  . amLODipine (NORVASC) 10 MG tablet Take 10 mg by mouth daily.  [name] aspirin 81 MG chewable tablet Chew 4 tablets (325 mg total) by mouth daily. (Patient taking differently: Chew 81 mg by mouth daily. )  . atorvastatin (LIPITOR) 80 MG tablet Take 80 mg by mouth daily.   . carvedilol (COREG) 12.5 MG tablet Take 12.5 mg by mouth 2 (two) times daily.  . celecoxib (CELEBREX) 200 MG capsule Take 200 mg by mouth as needed.  . etodolac (LODINE) 400 MG tablet Take 400 mg by  mouth as needed.  . hydrochlorothiazide (MICROZIDE) 12.5 MG capsule Take 12.5 mg by mouth daily.  [name] lisinopril (PRINIVIL,ZESTRIL) 10 MG tablet Take 10 mg by mouth daily.  [name] omeprazole (PRILOSEC) 20 MG capsule Take 20 mg by mouth at bedtime.   . orphenadrine (NORFLEX) 100 MG tablet Take 100 mg by mouth 2 (two) times daily.  . potassium chloride SA (K-DUR) 20 MEQ tablet Take 20 mEq by mouth at bedtime.  . traMADol (ULTRAM) 50 MG tablet Take 2 tablets (100 mg total) by mouth 4 (four) times daily. (Patient taking differently: Take 50 mg by mouth every 6 (six) hours as needed for moderate pain. )      Allergies:   Penicillins   Social History   Tobacco Use  . Smoking status: Former Games developer  . Smokeless tobacco: Never Used  Substance Use Topics  . Alcohol use: Yes    Comment: occ  . Drug use: No     Family Hx: The patient's family history includes Coronary artery disease in his unknown relative; Diabetes in his unknown relative. No premature CAD. Grandfathers both had CAD, diagnosed in their 4s and 82s. DM. Healthy siblings.   ROS:   Please see the history of present illness.    Constitutional: Negative for chills, fever, night sweats, unintentional weight loss  HENT: Negative for ear pain and hearing loss.   Eyes: Negative for loss of vision and eye pain.  Respiratory: Negative for cough, sputum, wheezing.   Cardiovascular: See HPI. Gastrointestinal: Negative for abdominal pain, melena, and hematochezia.  Genitourinary: Negative for dysuria and hematuria.  Musculoskeletal: Negative for falls and myalgias.  Skin: Negative for itching and rash.  Neurological: Negative for focal weakness, focal sensory changes and loss of consciousness.  Endo/Heme/Allergies: Does not bruise/bleed easily.  All other systems reviewed and are negative.   Prior CV studies:   The following studies were reviewed today: Cath 10/11. LHC showed 99% PDA, 60% OM2, EF 65%. He had 2.25 x 15 Promus DES to PDA.   Echo (11/11) showed EF 60%, mild to moderate LVH, moderate diastolic dysfunction.    ETT-myoview (10/12): 9' exercise, no ECG changes, EF 61%, normal perfusion images.   Lexiscan Cardiolite (10/14) with EF 48%, no ischemia or infarction.   Echo (10/14) with EF 55-60%.   Labs/Other Tests and Data Reviewed:    EKG:  An ECG dated 11/22/18 was personally reviewed today and demonstrated:  NSR, J point elevation in V2  Recent Labs: 11/19/2018: BUN 9; Creatinine, Ser 1.07; Hemoglobin 14.6; Platelets 248; Potassium 3.5; Sodium 138   Recent Lipid Panel Lab Results  Component Value Date/Time    CHOL 171 03/10/2017 07:13 AM   TRIG 256 (H) 03/10/2017 07:13 AM   HDL 54 03/10/2017 07:13 AM   CHOLHDL 3.2 03/10/2017 07:13 AM   LDLCALC 66 03/10/2017 07:13 AM    Wt Readings from Last 3 Encounters:  12/02/18 (!) 308 lb (139.7 kg)  11/19/18 290 lb (131.5 kg)  03/10/17 273 lb 6.4 oz (124 kg)     Objective:    Vital Signs:  BP 128/84   Temp 97.7 F (36.5 C)   Ht 6' (1.829 m)   Wt (!) 308 lb (139.7 kg)   BMI 41.77 kg/m    VITAL SIGNS:  reviewed GEN:  no acute distress EYES:  sclerae anicteric, EOMI - Extraocular Movements Intact RESPIRATORY:  normal respiratory effort, symmetric expansion CARDIOVASCULAR:  no visible JVD SKIN:  no rash, lesions or ulcers. MUSCULOSKELETAL:  no obvious  deformities. NEURO:  alert and oriented x 3, no obvious focal deficit PSYCH:  normal affect  ASSESSMENT & PLAN:    Chest pain: has atypical components, especially quality, alleviated with stretching. However, did come on after activity doing yard work, had lesions on cath in the 60-65% stenosis range, so also possible that he has had progression of his CAD -cannot treadmill due to arthritis, so will pursue lexiscan nuclear stress test for further evaluation -counseled on red flag warning signs that need immediate medical attention  History of CAD, with NSTEMI s/p stent 2011: -former smoker -on aspirin, high intensity statin -counseled on risks of NSAIDs with history of CAD. He was appreciative and will not take the celebrex or etodolac. Will use acetaminophen -secondary prevention reviewed, will do more formal education at follow up  Hypertension: -well controlled today -continue carvedilol, lisinopril, HCTZ, amlodipine  HLD -continue atorvastatin 80 mg -if not done recently, recheck lipids at follow up.  COVID-19 Education: The signs and symptoms of COVID-19 were discussed with the patient and how to seek care for testing (follow up with PCP or arrange E-visit).  The importance of  social distancing was discussed today.  Time:   Today, I have spent 27 minutes with the patient with telehealth technology discussing the above problems. Total time including review of prior studies, hospitalizations, and ER visits was 43 minutes.   Patient Instructions  Medication Instructions:  Your Physician recommend you continue on your current medication as directed.    If you need a refill on your cardiac medications before your next appointment, please call your pharmacy.   Lab work: None  Testing/Procedures: Your physician has requested that you have a lexiscan myoview. For further information please visit [email or web address] Please follow instruction sheet, as given. [address]   Follow-Up: At [facility], you and your health needs are our priority.  As part of our continuing mission to provide you with exceptional heart care, we have created designated Provider Care Teams.  These Care Teams include your primary Cardiologist (physician) and Advanced Practice Providers (APPs -  Physician Assistants and Nurse Practitioners) who all work together to provide you with the care you need, when you need it. You will need a follow up appointment in 3 months.  Please call our office 2 months in advance to schedule this appointment.  You may see Jodelle Red, MD or one of the following Advanced Practice Providers on your designated Care Team:   Theodore Demark, PA-C . Joni Reining, DNP, ANP  You are scheduled for a Myocardial Perfusion Imaging Study.  Please arrive 15 minutes prior to your appointment time for registration and insurance purposes.  The test will take approximately 3 to 4 hours to complete; you may bring reading material.  If someone comes with you to your appointment, they will need to remain in the main lobby due to limited space in the testing area. If you are pregnant or breastfeeding, please notify the nuclear lab prior to your  appointment  How to prepare for your Myocardial Perfusion Test: . Do not eat or drink 3 hours prior to your test, except you may have water. . Do not consume products containing caffeine (regular or decaffeinated) 12 hours prior to your test. (ex: coffee, chocolate, sodas, tea). . Do bring a list of your current medications with you.  If not listed below, you may take your medications as normal."} . Do wear comfortable clothes (no dresses or overalls) and walking shoes,  tennis shoes preferred (No heels or open toe shoes are allowed). . Do NOT wear cologne, perfume, aftershave, or lotions (deodorant is allowed). . If these instructions are not followed, your test will have to be rescheduled.    If you cannot keep your appointment, please provide 24 hours notification to the Nuclear Lab, to avoid a possible $50 charge to your account.      Medication Adjustments/Labs and Tests Ordered: Current medicines are reviewed at length with the patient today.  Concerns regarding medicines are outlined above.   Tests Ordered: Orders Placed This Encounter  Procedures  . MYOCARDIAL PERFUSION IMAGING    Medication Changes: Meds ordered this encounter  Medications  . aspirin 81 MG chewable tablet    Sig: Chew 1 tablet (81 mg total) by mouth daily.    Disposition:  Follow up 3 mos or sooner based on results of testing  Signed, Jodelle Red, MD  12/02/2018 7:52 AM    [facility]

## 2018-12-02 NOTE — Patient Instructions (Addendum)
Medication Instructions:  Your Physician recommend you continue on your current medication as directed.    If you need a refill on your cardiac medications before your next appointment, please call your pharmacy.   Lab work: None  Testing/Procedures: Your physician has requested that you have a lexiscan myoview. For further information please visit [email or web address] Please follow instruction sheet, as given. [address]   Follow-Up: At [facility], you and your health needs are our priority.  As part of our continuing mission to provide you with exceptional heart care, we have created designated Provider Care Teams.  These Care Teams include your primary Cardiologist (physician) and Advanced Practice Providers (APPs -  Physician Assistants and Nurse Practitioners) who all work together to provide you with the care you need, when you need it. You will need a follow up appointment in 3 months.  Please call our office 2 months in advance to schedule this appointment.  You may see Jodelle Red, MD or one of the following Advanced Practice Providers on your designated Care Team:   Theodore Demark, PA-C . Joni Reining, DNP, ANP  You are scheduled for a Myocardial Perfusion Imaging Study.  Please arrive 15 minutes prior to your appointment time for registration and insurance purposes.  The test will take approximately 3 to 4 hours to complete; you may bring reading material.  If someone comes with you to your appointment, they will need to remain in the main lobby due to limited space in the testing area. If you are pregnant or breastfeeding, please notify the nuclear lab prior to your appointment  How to prepare for your Myocardial Perfusion Test: . Do not eat or drink 3 hours prior to your test, except you may have water. . Do not consume products containing caffeine (regular or decaffeinated) 12 hours prior to your test. (ex: coffee, chocolate, sodas,  tea). . Do bring a list of your current medications with you.  If not listed below, you may take your medications as normal."} . Do wear comfortable clothes (no dresses or overalls) and walking shoes, tennis shoes preferred (No heels or open toe shoes are allowed). . Do NOT wear cologne, perfume, aftershave, or lotions (deodorant is allowed). . If these instructions are not followed, your test will have to be rescheduled.    If you cannot keep your appointment, please provide 24 hours notification to the Nuclear Lab, to avoid a possible $50 charge to your account.

## 2019-01-12 ENCOUNTER — Telehealth (HOSPITAL_COMMUNITY): Payer: Self-pay

## 2019-01-12 NOTE — Telephone Encounter (Signed)
Encounter complete. 

## 2019-01-13 ENCOUNTER — Telehealth (HOSPITAL_COMMUNITY): Payer: Self-pay

## 2019-01-13 NOTE — Telephone Encounter (Signed)
Encounter complete. 

## 2019-01-17 ENCOUNTER — Other Ambulatory Visit: Payer: Self-pay

## 2019-01-17 ENCOUNTER — Ambulatory Visit (HOSPITAL_COMMUNITY)
Admission: RE | Admit: 2019-01-17 | Discharge: 2019-01-17 | Disposition: A | Discharge status: Home / Self Care | Payer: BC Managed Care – PPO | Source: Ambulatory Visit | Attending: Cardiovascular Disease | Admitting: Cardiovascular Disease

## 2019-01-17 DIAGNOSIS — R072 Precordial pain: Secondary | ICD-10-CM | POA: Insufficient documentation

## 2019-01-17 LAB — MYOCARDIAL PERFUSION IMAGING
LV dias vol: 163 mL (ref 62–150)
LV sys vol: 77 mL
Peak HR: 86 {beats}/min
Rest HR: 58 {beats}/min
SDS: 5
SRS: 2
SSS: 7
TID: 1.29

## 2019-01-17 MED ORDER — TECHNETIUM TC 99M TETROFOSMIN IV KIT
29.1000 | PACK | Freq: Once | INTRAVENOUS | Status: AC | PRN
  Administered 2019-01-17: 29.1 via INTRAVENOUS
  Filled 2019-01-17: qty 30

## 2019-01-17 MED ORDER — TECHNETIUM TC 99M TETROFOSMIN IV KIT
10.2000 | PACK | Freq: Once | INTRAVENOUS | Status: AC | PRN
  Administered 2019-01-17: 10.2 via INTRAVENOUS
  Filled 2019-01-17: qty 11

## 2019-01-17 MED ORDER — REGADENOSON 0.4 MG/5ML IV SOLN
0.4000 mg | Freq: Once | INTRAVENOUS | Status: AC
  Administered 2019-01-17: 0.4 mg via INTRAVENOUS

## 2019-01-18 ENCOUNTER — Encounter (HOSPITAL_COMMUNITY): Payer: BC Managed Care – PPO

## 2019-01-20 ENCOUNTER — Telehealth: Payer: Self-pay

## 2019-01-20 NOTE — Telephone Encounter (Signed)
   Pt updated with visitor's policy and informed to wear a mask. Pt voiced understanding.   COVID-19 Pre-Screening Questions:  . In the past 7 to 10 days have you had a cough,  shortness of breath, headache, congestion, fever (100 or greater) body aches, chills, sore throat, or sudden loss of taste or sense of smell? No . Have you been around anyone with known Covid 19. No . Have you been around anyone who is awaiting Covid 19 test results in the past 7 to 10 days? No . Have you been around anyone who has been exposed to Covid 19, or has mentioned symptoms of Covid 19 within the past 7 to 10 days? No  If you have any concerns/questions about symptoms patients report during screening (either on the phone or at threshold). Contact the provider seeing the patient or DOD for further guidance.  If neither are available contact a member of the leadership team.             

## 2019-01-24 ENCOUNTER — Other Ambulatory Visit: Payer: Self-pay

## 2019-01-24 ENCOUNTER — Encounter: Payer: Self-pay | Admitting: Cardiology

## 2019-01-24 ENCOUNTER — Ambulatory Visit: Payer: BC Managed Care – PPO | Admitting: Cardiology

## 2019-01-24 VITALS — BP 135/82 | HR 74 | Temp 97.3°F | Ht 72.0 in | Wt 311.6 lb

## 2019-01-24 DIAGNOSIS — I2510 Atherosclerotic heart disease of native coronary artery without angina pectoris: Secondary | ICD-10-CM

## 2019-01-24 DIAGNOSIS — Z7189 Other specified counseling: Secondary | ICD-10-CM

## 2019-01-24 DIAGNOSIS — I10 Essential (primary) hypertension: Secondary | ICD-10-CM

## 2019-01-24 DIAGNOSIS — Z79899 Other long term (current) drug therapy: Secondary | ICD-10-CM

## 2019-01-24 DIAGNOSIS — R9439 Abnormal result of other cardiovascular function study: Secondary | ICD-10-CM

## 2019-01-24 DIAGNOSIS — Z712 Person consulting for explanation of examination or test findings: Secondary | ICD-10-CM | POA: Diagnosis not present

## 2019-01-24 DIAGNOSIS — E7800 Pure hypercholesterolemia, unspecified: Secondary | ICD-10-CM

## 2019-01-24 MED ORDER — SPIRONOLACTONE 25 MG PO TABS: 25.0000 mg | ORAL_TABLET | Freq: Every day | ORAL | 11 refills | Status: DC

## 2019-01-24 NOTE — Progress Notes (Signed)
Cardiology Office Note:    Date:  01/24/2019   ID:  Allen HildingJeffery B Bethel, DOB [birth year], MRN [id number]  PCP:  Verlon AuBoyd, Tammy Lamonica, MD  Cardiologist:  Jodelle RedBridgette Noa Galvao, MD PhD (Prior Dr. Shirlee LatchMcLean)  Referring MD: Verlon AuBoyd, Tammy Lamonica, MD   CC: follow up results of lexiscan stress test  History of Present Illness:    [name] is a 53 y.o. male with a hx of CAD s/p NSTEMI 2011 (DES to PDA), HTN, HLD, OSA on CPAP, possible TIA 2018 who is seen for followup today.  Today: Doing well overall. Has not had additional chest pain since our last visit. We discussed the results of his lexiscan together.   Has been able to be active, currently building a patio. Mildly short of breath with heavy exertion but no chest pain. His breathing is good in general, though he did feel SOB with lexiscan. Doing well with aspirin and atorvastatin, takes at least 6 days out of 7. No issues with bruising or bleeding. Has tolerated joint injections without issues. Takes BP at home, range 130-140/85-95. No history of diabetes.   Denies chest pain, shortness of breath at rest or with normal exertion. No PND, orthopnea, LE edema or unexpected weight gain. No syncope or palpitations.  Past Medical History:  Diagnosis Date  . Coronary artery disease    NSTEMI 10/11 LHC showed 99% OM2, EF 65%. He had 2.25 X 15 promus DES to PDA. Echo (11/11) showed EF 60% mild to moderate LVH, moderate diastolic dysfunction;  ETT-myoview (10/12): 9' exercise, no ECG changes, EF 61%, normal perfusion images.   Lexiscan Myoview (10/14):  Low risk, no ischemia, EF 48%  . ED (erectile dysfunction)   . Ex-smoker   . GERD (gastroesophageal reflux disease)   . Hx of echocardiogram    Echo (10/14):  Mod LVH, EF 55-60%  . Hyperlipidemia   . Hypertension   . MI (myocardial infarction) (HCC) 2011  . OSA (obstructive sleep apnea)    CPAP  . S/P appendectomy     Past Surgical History:  Procedure Laterality Date  . APPENDECTOMY     . CORONARY STENT PLACEMENT     x1    Current Medications: Current Outpatient Medications on File Prior to Visit  Medication Sig  . amLODipine (NORVASC) 10 MG tablet Take 10 mg by mouth daily.  [name]. aspirin 81 MG chewable tablet Chew 1 tablet (81 mg total) by mouth daily.  [name]. atorvastatin (LIPITOR) 80 MG tablet Take 80 mg by mouth daily.   . carvedilol (COREG) 12.5 MG tablet Take 12.5 mg by mouth 2 (two) times daily.  . hydrochlorothiazide (MICROZIDE) 12.5 MG capsule Take 12.5 mg by mouth daily.  [name]. lisinopril (PRINIVIL,ZESTRIL) 10 MG tablet Take 10 mg by mouth daily.  . Multiple Vitamin (MULTIVITAMIN WITH MINERALS) TABS tablet Take 1 tablet by mouth daily.  . nitroGLYCERIN (NITROSTAT) 0.4 MG SL tablet Place 0.4 mg under the tongue every 5 (five) minutes as needed for chest pain.  [name]. omeprazole (PRILOSEC) 20 MG capsule Take 20 mg by mouth at bedtime.   . potassium chloride SA (K-DUR) 20 MEQ tablet Take 20 mEq by mouth at bedtime.  . traMADol (ULTRAM) 50 MG tablet Take 2 tablets (100 mg total) by mouth 4 (four) times daily. (Patient taking differently: Take 50 mg by mouth every 6 (six) hours as needed for moderate pain. )  . orphenadrine (NORFLEX) 100 MG tablet Take 100 mg by mouth 2 (two) times daily.   No  current facility-administered medications on file prior to visit.      Allergies:   Penicillins   Social History   Socioeconomic History  . Marital status: Married    Spouse name: Not on file  . Number of children: Not on file  . Years of education: Not on file  . Highest education level: Not on file  Occupational History  . Not on file  Social Needs  . Financial resource strain: Not on file  . Food insecurity    Worry: Not on file    Inability: Not on file  . Transportation needs    Medical: Not on file    Non-medical: Not on file  Tobacco Use  . Smoking status: Former Research scientist (life sciences)  . Smokeless tobacco: Never Used  Substance and Sexual Activity  . Alcohol use: Yes    Comment:  occ  . Drug use: No  . Sexual activity: Not on file  Lifestyle  . Physical activity    Days per week: Not on file    Minutes per session: Not on file  . Stress: Not on file  Relationships  . Social Herbalist on phone: Not on file    Gets together: Not on file    Attends religious service: Not on file    Active member of club or organization: Not on file    Attends meetings of clubs or organizations: Not on file    Relationship status: Not on file  Other Topics Concern  . Not on file  Social History Narrative  . Not on file     Family History: The patient's family history includes Coronary artery disease in his unknown relative; Diabetes in his unknown relative.  ROS:   Please see the history of present illness.  Additional pertinent ROS:  Constitutional: Negative for chills, fever, night sweats, unintentional weight loss  HENT: Negative for ear pain and hearing loss.   Eyes: Negative for loss of vision and eye pain.  Respiratory: Negative for cough, sputum, shortness of breath, wheezing.   Cardiovascular: See HPI. Gastrointestinal: Negative for abdominal pain, melena, and hematochezia.  Genitourinary: Negative for dysuria and hematuria.  Musculoskeletal: Negative for falls and myalgias.  Skin: Negative for itching and rash.  Neurological: Negative for focal weakness, focal sensory changes and loss of consciousness.  Endo/Heme/Allergies: Does not bruise/bleed easily.    EKGs/Labs/Other Studies Reviewed:    The following studies were reviewed today: Nuclear lexiscan 01/17/19  The left ventricular ejection fraction is mildly decreased (45-54%).  Nuclear stress EF: 53%.  There was no ST segment deviation noted during stress.  Defect 1: There is a medium defect of severe severity present in the basal inferoseptal, basal inferior, mid inferior and apical inferior location.  Findings consistent with prior myocardial infarction with peri-infarct ischemia.   This is an intermediate risk study.   There is a medium size defect in the inferior wall from base to apex, including the inferoseptum at the base, with partial reversibility.  At the apex, the inferior defect is moderate and fixed. At the mid ventricle the inferior wall defect is severe with stress, with partial reversibility to mild at rest. At the base, the inferior defect is severe, with partial reversibility to moderate, and the inferoseptal defect is moderate, with partial reversibility to mild at rest.   Due to body habitus and extracardiac uptake, there may be a component of attenuation artifact in the inferior wall at rest and with stress.   EKG:  EKG is personally reviewed.  The ekg ordered today demonstrates NSR  Recent Labs: 11/19/2018: BUN 9; Creatinine, Ser 1.07; Hemoglobin 14.6; Platelets 248; Potassium 3.5; Sodium 138  Recent Lipid Panel    Component Value Date/Time   CHOL 171 03/10/2017 0713   TRIG 256 (H) 03/10/2017 0713   HDL 54 03/10/2017 0713   CHOLHDL 3.2 03/10/2017 0713   VLDL 51 (H) 03/10/2017 0713   LDLCALC 66 03/10/2017 0713    Physical Exam:    VS:  BP 135/82   Pulse 74   Temp (!) 97.3 F (36.3 C)   Ht 6' (1.829 m)   Wt (!) 311 lb 9.6 oz (141.3 kg)   SpO2 94%   BMI 42.26 kg/m     Wt Readings from Last 3 Encounters:  01/24/19 (!) 311 lb 9.6 oz (141.3 kg)  01/17/19 (!) 308 lb (139.7 kg)  12/02/18 (!) 308 lb (139.7 kg)     GEN: Well nourished, well developed in no acute distress HEENT: Normal NECK: No JVD; No carotid bruits LYMPHATICS: No lymphadenopathy CARDIAC: regular rhythm, normal S1 and S2, no murmurs, rubs, gallops. Radial and DP pulses 2+ bilaterally. RESPIRATORY:  Clear to auscultation without rales, wheezing or rhonchi  ABDOMEN: Soft, non-tender, non-distended MUSCULOSKELETAL:  No edema; No deformity  SKIN: Warm and dry NEUROLOGIC:  Alert and oriented x 3 PSYCHIATRIC:  Normal affect   ASSESSMENT:    1. Coronary artery disease  involving native coronary artery of native heart without angina pectoris   2. Medication management   3. Encounter to discuss test results   4. Abnormal nuclear stress test   5. Pure hypercholesterolemia   6. Essential hypertension   7. Cardiac risk counseling   8. Counseling on health promotion and disease prevention    PLAN:    History of CAD, recent episode of atypical chest pain, abnormal stress test: -he now endorses no further chest pain. We reviewed the results of his stress test at length. Images suggest infarct with some peri-infarct ischemia but near normal EF -discussed utility of cath vs. Trying to optimize his medical management -as he has had no further pain, he would like to avoid cath at this time -continue aspirin 81 mg, atorvastatin 80 mg -counseled on red flag warning signs that need immediate medical attention  Hypercholesterolemia: no recent lipids, last in 2018 with LDL 66. Goal LDL <70 -we will order repeat lipids to monitor his response to medical therapy -continue atorvastatin 80 mg  Hypertension: continue carvedilol 12.5 mg BID, HCTZ 12.5 mg daily, lisinopril 10 mg daily, spironolactone 25 mg daily -recheck bmet for med management  Cardiac risk counseling and prevention recommendations: -recommend heart healthy/Mediterranean diet, with whole grains, fruits, vegetable, fish, lean meats, nuts, and olive oil. Limit salt. -recommend moderate walking, 3-5 times/week for 30-50 minutes each session. Aim for at least 150 minutes.week. Goal should be pace of 3 miles/hours, or walking 1.5 miles in 30 minutes -recommend avoidance of tobacco products. Avoid excess alcohol.  Plan for follow up: 3 mos to monitor symptoms  Medication Adjustments/Labs and Tests Ordered: Current medicines are reviewed at length with the patient today.  Concerns regarding medicines are outlined above.  Orders Placed This Encounter  Procedures  . Basic metabolic panel  . Lipid panel  .  EKG 12-Lead   Meds ordered this encounter  Medications  . spironolactone (ALDACTONE) 25 MG tablet    Sig: Take 1 tablet (25 mg total) by mouth daily.    Dispense:  30  tablet    Refill:  11    Patient Instructions  Medication Instructions:  Stop: Potassium 20 meq Start: Spironolactone 25 mg daily  If you need a refill on your cardiac medications before your next appointment, please call your pharmacy.   Lab work: Your physician recommends that you return for lab work in 1 week (BMP, Fasting lipid)  If you have labs (blood work) drawn today and your tests are completely normal, you will receive your results only by: [name]. MyChart Message (if you have MyChart) OR . A paper copy in the mail If you have any lab test that is abnormal or we need to change your treatment, we will call you to review the results.  Testing/Procedures: None  Follow-Up: At [facility], you and your health needs are our priority.  As part of our continuing mission to provide you with exceptional heart care, we have created designated Provider Care Teams.  These Care Teams include your primary Cardiologist (physician) and Advanced Practice Providers (APPs -  Physician Assistants and Nurse Practitioners) who all work together to provide you with the care you need, when you need it. You will need a follow up appointment in 3 months.  Please call our office 2 months in advance to schedule this appointment.  You may see Jodelle RedBridgette Kolson Chovanec, MD or one of the following Advanced Practice Providers on your designated Care Team:   Theodore DemarkRhonda Barrett, PA-C . Joni ReiningKathryn Lawrence, DNP, ANP      Signed, Jodelle RedBridgette Juliette Standre, MD PhD 01/24/2019  [facility]

## 2019-01-24 NOTE — Patient Instructions (Addendum)
Medication Instructions:  Stop: Potassium 20 meq Start: Spironolactone 25 mg daily  If you need a refill on your cardiac medications before your next appointment, please call your pharmacy.   Lab work: Your physician recommends that you return for lab work in 1 week (BMP, Fasting lipid)  If you have labs (blood work) drawn today and your tests are completely normal, you will receive your results only by: [name] MyChart Message (if you have MyChart) OR . A paper copy in the mail If you have any lab test that is abnormal or we need to change your treatment, we will call you to review the results.  Testing/Procedures: None  Follow-Up: At [facility], you and your health needs are our priority.  As part of our continuing mission to provide you with exceptional heart care, we have created designated Provider Care Teams.  These Care Teams include your primary Cardiologist (physician) and Advanced Practice Providers (APPs -  Physician Assistants and Nurse Practitioners) who all work together to provide you with the care you need, when you need it. You will need a follow up appointment in 3 months.  Please call our office 2 months in advance to schedule this appointment.  You may see Buford Dresser, MD or one of the following Advanced Practice Providers on your designated Care Team:   Rosaria Ferries, PA-C . Jory Sims, DNP, ANP

## 2019-01-27 ENCOUNTER — Encounter: Payer: Self-pay | Admitting: Cardiology

## 2019-01-27 DIAGNOSIS — R9439 Abnormal result of other cardiovascular function study: Secondary | ICD-10-CM | POA: Insufficient documentation

## 2019-02-02 LAB — LIPID PANEL
Chol/HDL Ratio: 3 ratio (ref 0.0–5.0)
Cholesterol, Total: 184 mg/dL (ref 100–199)
HDL: 62 mg/dL (ref >39)
LDL Calculated: 101 mg/dL — ABNORMAL HIGH (ref 0–99)
Triglycerides: 103 mg/dL (ref 0–149)
VLDL Cholesterol Cal: 21 mg/dL (ref 5–40)

## 2019-02-02 LAB — BASIC METABOLIC PANEL
BUN/Creatinine Ratio: 14 (ref 9–20)
BUN: 17 mg/dL (ref 6–24)
CO2: 26 mmol/L (ref 20–29)
Calcium: 9.7 mg/dL (ref 8.7–10.2)
Chloride: 97 mmol/L (ref 96–106)
Creatinine, Ser: 1.23 mg/dL (ref 0.76–1.27)
GFR calc Af Amer: 77 mL/min/{1.73_m2} (ref >59)
GFR calc non Af Amer: 67 mL/min/{1.73_m2} (ref >59)
Glucose: 96 mg/dL (ref 65–99)
Potassium: 4.3 mmol/L (ref 3.5–5.2)
Sodium: 140 mmol/L (ref 134–144)

## 2019-02-07 ENCOUNTER — Telehealth: Payer: Self-pay | Admitting: Cardiology

## 2019-02-07 DIAGNOSIS — I2510 Atherosclerotic heart disease of native coronary artery without angina pectoris: Secondary | ICD-10-CM

## 2019-02-07 DIAGNOSIS — E7800 Pure hypercholesterolemia, unspecified: Secondary | ICD-10-CM

## 2019-02-07 MED ORDER — EZETIMIBE 10 MG PO TABS: 10.0000 mg | ORAL_TABLET | Freq: Every day | ORAL | 3 refills | Status: DC

## 2019-02-07 NOTE — Telephone Encounter (Signed)
New message ° ° °Patient is returning call for lab results. Please call. °

## 2019-02-07 NOTE — Telephone Encounter (Signed)
Spoke to patient lab results given.He stated he has missed only a couple of doses of Atorvastatin.Advised to take as directed.Advised Dr.Christopher wants him to start taking Zetia 10 mg daily.Repeat fasting lipid and liver panels in 3 months.Lab orders mailed.

## 2019-02-13 ENCOUNTER — Telehealth: Payer: Self-pay | Admitting: Cardiology

## 2019-02-13 NOTE — Telephone Encounter (Signed)
  Patient calling the office for samples of medication:   1.  What medication and dosage are you requesting samples for? ezetimibe (ZETIA) 10 MG tablet  2.  Are you currently out of this medication? Yes

## 2019-02-13 NOTE — Telephone Encounter (Signed)
Left message for patient, no samples available.

## 2019-03-14 ENCOUNTER — Telehealth: Payer: Self-pay | Admitting: Cardiology

## 2019-03-14 NOTE — Telephone Encounter (Signed)
New Message ° °Patient returning phone call please call back. °

## 2019-03-14 NOTE — Telephone Encounter (Signed)
Patient calling back- appointment with 9/18 with Dr.Christopher, is patient suppose to be in office or virtual? Will route to primary nurse.

## 2019-03-15 NOTE — Telephone Encounter (Signed)
Spoke with pt who report he would rather cancel appointment for 9/18 and keep 10/28. He report he is doing well since starting new medication and will call office if any concerns.

## 2019-03-17 ENCOUNTER — Ambulatory Visit: Payer: BC Managed Care – PPO | Admitting: Cardiology

## 2019-04-26 ENCOUNTER — Encounter: Payer: Self-pay | Admitting: Cardiology

## 2019-04-26 ENCOUNTER — Ambulatory Visit (INDEPENDENT_AMBULATORY_CARE_PROVIDER_SITE_OTHER): Payer: BLUE CROSS/BLUE SHIELD | Admitting: Cardiology

## 2019-04-26 ENCOUNTER — Other Ambulatory Visit: Payer: Self-pay

## 2019-04-26 VITALS — BP 133/88 | HR 65 | Ht 72.0 in | Wt 319.0 lb

## 2019-04-26 DIAGNOSIS — I2510 Atherosclerotic heart disease of native coronary artery without angina pectoris: Secondary | ICD-10-CM | POA: Diagnosis not present

## 2019-04-26 DIAGNOSIS — E7800 Pure hypercholesterolemia, unspecified: Secondary | ICD-10-CM | POA: Diagnosis not present

## 2019-04-26 DIAGNOSIS — I10 Essential (primary) hypertension: Secondary | ICD-10-CM

## 2019-04-26 DIAGNOSIS — Z7189 Other specified counseling: Secondary | ICD-10-CM

## 2019-04-26 LAB — HEPATIC FUNCTION PANEL
ALT: 49 IU/L — ABNORMAL HIGH (ref 0–44)
AST: 26 IU/L (ref 0–40)
Albumin: 4.6 g/dL (ref 3.8–4.9)
Alkaline Phosphatase: 83 IU/L (ref 39–117)
Bilirubin Total: 0.4 mg/dL (ref 0.0–1.2)
Bilirubin, Direct: 0.13 mg/dL (ref 0.00–0.40)
Total Protein: 7.9 g/dL (ref 6.0–8.5)

## 2019-04-26 LAB — LIPID PANEL
Chol/HDL Ratio: 2.6 ratio (ref 0.0–5.0)
Cholesterol, Total: 160 mg/dL (ref 100–199)
HDL: 61 mg/dL (ref >39)
LDL Chol Calc (NIH): 80 mg/dL (ref 0–99)
Triglycerides: 106 mg/dL (ref 0–149)
VLDL Cholesterol Cal: 19 mg/dL (ref 5–40)

## 2019-04-26 NOTE — Progress Notes (Signed)
Cardiology Office Note:    Date:  04/26/2019   ID:  [name], DOB [birth year], MRN [id number]  PCP:  Allen Au, MD  Cardiologist:  Allen Red, MD PhD (Prior Dr. Shirlee Latch)  Referring MD: Allen Au, MD   CC: follow up  History of Present Illness:    [name] is a 53 y.o. male with a hx of CAD s/p NSTEMI 2011 (DES to PDA), HTN, HLD, OSA on CPAP, possible TIA 2018 who is seen for followup today.  Today: Overall doing ok. Had one brief episode of vertigo a few weeks ago, has had before, lasted about a week then went away.   No chest pain. Has been digging, doing some walking, no chest pain with this. Gets mild shortness of breath/fatigue with heavy exertion--can go an hour or more before needing to rest. Improves with short period of rest.  No issues with medications.   Denies chest pain, shortness of breath at rest or with light exertion. No PND, orthopnea, LE edema or unexpected weight gain. No syncope or palpitations.  Past Medical History:  Diagnosis Date  . Coronary artery disease    NSTEMI 10/11 LHC showed 99% OM2, EF 65%. He had 2.25 X 15 promus DES to PDA. Echo (11/11) showed EF 60% mild to moderate LVH, moderate diastolic dysfunction;  ETT-myoview (10/12): 9' exercise, no ECG changes, EF 61%, normal perfusion images.   Lexiscan Myoview (10/14):  Low risk, no ischemia, EF 48%  . ED (erectile dysfunction)   . Ex-smoker   . GERD (gastroesophageal reflux disease)   . Hx of echocardiogram    Echo (10/14):  Mod LVH, EF 55-60%  . Hyperlipidemia   . Hypertension   . MI (myocardial infarction) (HCC) 2011  . OSA (obstructive sleep apnea)    CPAP  . S/P appendectomy     Past Surgical History:  Procedure Laterality Date  . APPENDECTOMY    . CORONARY STENT PLACEMENT     x1    Current Medications: Current Outpatient Medications on File Prior to Visit  Medication Sig  . amLODipine (NORVASC) 10 MG tablet Take 10 mg by mouth  daily.  [name] aspirin 81 MG chewable tablet Chew 1 tablet (81 mg total) by mouth daily.  [name] atorvastatin (LIPITOR) 80 MG tablet Take 80 mg by mouth daily.   . carvedilol (COREG) 12.5 MG tablet Take 12.5 mg by mouth 2 (two) times daily.  [name] ezetimibe (ZETIA) 10 MG tablet Take 1 tablet (10 mg total) by mouth daily.  . hydrochlorothiazide (MICROZIDE) 12.5 MG capsule Take 12.5 mg by mouth daily.  [name] lisinopril (PRINIVIL,ZESTRIL) 10 MG tablet Take 10 mg by mouth daily.  . Multiple Vitamin (MULTIVITAMIN WITH MINERALS) TABS tablet Take 1 tablet by mouth daily.  . nitroGLYCERIN (NITROSTAT) 0.4 MG SL tablet Place 0.4 mg under the tongue every 5 (five) minutes as needed for chest pain.  [name] omeprazole (PRILOSEC) 20 MG capsule Take 20 mg by mouth at bedtime.   [name] spironolactone (ALDACTONE) 25 MG tablet Take 1 tablet (25 mg total) by mouth daily.  . traMADol (ULTRAM) 50 MG tablet Take 2 tablets (100 mg total) by mouth 4 (four) times daily. (Patient taking differently: Take 50 mg by mouth every 6 (six) hours as needed for moderate pain. )   No current facility-administered medications on file prior to visit.      Allergies:   Penicillins   Social History   Socioeconomic History  . Marital status: Married  Spouse name: Not on file  . Number of children: Not on file  . Years of education: Not on file  . Highest education level: Not on file  Occupational History  . Not on file  Social Needs  . Financial resource strain: Not on file  . Food insecurity    Worry: Not on file    Inability: Not on file  . Transportation needs    Medical: Not on file    Non-medical: Not on file  Tobacco Use  . Smoking status: Former Games developer  . Smokeless tobacco: Never Used  Substance and Sexual Activity  . Alcohol use: Yes    Comment: occ  . Drug use: No  . Sexual activity: Not on file  Lifestyle  . Physical activity    Days per week: Not on file    Minutes per session: Not on file  . Stress: Not on file   Relationships  . Social Musician on phone: Not on file    Gets together: Not on file    Attends religious service: Not on file    Active member of club or organization: Not on file    Attends meetings of clubs or organizations: Not on file    Relationship status: Not on file  Other Topics Concern  . Not on file  Social History Narrative  . Not on file     Family History: The patient's family history includes Coronary artery disease in his unknown relative; Diabetes in his unknown relative.  ROS:   Please see the history of present illness.  Additional pertinent ROS: Constitutional: Negative for chills, fever, night sweats, unintentional weight loss  HENT: Negative for ear pain and hearing loss.   Eyes: Negative for loss of vision and eye pain.  Respiratory: Negative for cough, sputum, wheezing.   Cardiovascular: See HPI. Gastrointestinal: Negative for abdominal pain, melena, and hematochezia.  Genitourinary: Negative for dysuria and hematuria.  Musculoskeletal: Negative for falls and myalgias.  Skin: Negative for itching and rash.  Neurological: Negative for focal weakness, focal sensory changes and loss of consciousness.  Endo/Heme/Allergies: Does not bruise/bleed easily.    EKGs/Labs/Other Studies Reviewed:    The following studies were reviewed today: Nuclear lexiscan 01/17/19  The left ventricular ejection fraction is mildly decreased (45-54%).  Nuclear stress EF: 53%.  There was no ST segment deviation noted during stress.  Defect 1: There is a medium defect of severe severity present in the basal inferoseptal, basal inferior, mid inferior and apical inferior location.  Findings consistent with prior myocardial infarction with peri-infarct ischemia.  This is an intermediate risk study.   There is a medium size defect in the inferior wall from base to apex, including the inferoseptum at the base, with partial reversibility.  At the apex, the  inferior defect is moderate and fixed. At the mid ventricle the inferior wall defect is severe with stress, with partial reversibility to mild at rest. At the base, the inferior defect is severe, with partial reversibility to moderate, and the inferoseptal defect is moderate, with partial reversibility to mild at rest.   Due to body habitus and extracardiac uptake, there may be a component of attenuation artifact in the inferior wall at rest and with stress.   EKG:  EKG is personally reviewed.  The ekg ordered 01/24/19 demonstrates NSR  Recent Labs: 11/19/2018: Hemoglobin 14.6; Platelets 248 02/02/2019: BUN 17; Creatinine, Ser 1.23; Potassium 4.3; Sodium 140  Recent Lipid Panel    Component  Value Date/Time   CHOL 184 02/02/2019 0837   TRIG 103 02/02/2019 0837   HDL 62 02/02/2019 0837   CHOLHDL 3.0 02/02/2019 0837   CHOLHDL 3.2 03/10/2017 0713   VLDL 51 (H) 03/10/2017 0713   LDLCALC 101 (H) 02/02/2019 0837    Physical Exam:    VS:  BP 133/88   Pulse 65   Ht 6' (1.829 m)   Wt (!) 319 lb (144.7 kg)   SpO2 99%   BMI 43.26 kg/m     Wt Readings from Last 3 Encounters:  04/26/19 (!) 319 lb (144.7 kg)  01/24/19 (!) 311 lb 9.6 oz (141.3 kg)  01/17/19 (!) 308 lb (139.7 kg)    GEN: Well nourished, well developed in no acute distress HEENT: Normal, moist mucous membranes NECK: No JVD CARDIAC: regular rhythm, normal S1 and S2, no rubs or gallops. No murmurs. VASCULAR: Radial and DP pulses 2+ bilaterally. No carotid bruits RESPIRATORY:  Clear to auscultation without rales, wheezing or rhonchi  ABDOMEN: Soft, non-tender, non-distended MUSCULOSKELETAL:  Ambulates independently SKIN: Warm and dry, no edema NEUROLOGIC:  Alert and oriented x 3. No focal neuro deficits noted. PSYCHIATRIC:  Normal affect   ASSESSMENT:    1. Coronary artery disease involving native coronary artery of native heart without angina pectoris   2. Pure hypercholesterolemia   3. Essential hypertension   4.  Cardiac risk counseling   5. Counseling on health promotion and disease prevention    PLAN:    History of CAD, mildly abnormal stress test: -nuclear images suggest infarct with some peri-infarct ischemia but near normal EF -With no further pain, will plan for medical management -continue aspirin 81 mg, atorvastatin 80 mg, ezetimibe 10 mg, carvedilol 12.5 mg BID -counseled on [name] flag warning signs that need immediate medical attention  Hypercholesterolemia: Goal LDL <70 -was not at goal on max atorvastatin alone, added ezetimibe in August -recheck lipids and LFTs today -continue atorvastatin 80 mg, ezetimibe 10 mg -if not at goal, will need to consider PCKS9i  Hypertension: near goal today of <130/80. Wants to continue to try with lifestyle -continue carvedilol 12.5 mg BID, HCTZ 12.5 mg daily, lisinopril 10 mg daily, spironolactone 25 mg daily  Cardiac risk counseling and prevention recommendations: -recommend heart healthy/Mediterranean diet, with whole grains, fruits, vegetable, fish, lean meats, nuts, and olive oil. Limit salt. -recommend moderate walking, 3-5 times/week for 30-50 minutes each session. Aim for at least 150 minutes.week. Goal should be pace of 3 miles/hours, or walking 1.5 miles in 30 minutes -recommend avoidance of tobacco products. Avoid excess alcohol.  Plan for follow up: 6 mos  Medication Adjustments/Labs and Tests Ordered: Current medicines are reviewed at length with the patient today.  Concerns regarding medicines are outlined above.   Check lipids/LFTs  Patient Instructions  Medication Instructions:  Your physician recommends that you continue on your current medications as directed. Please refer to the Current Medication list given to you today.  If you need a refill on your cardiac medications before your next appointment, please call your pharmacy.   Lab work: Lipids and Hepatic Function  Testing/Procedures: NONE  Follow-Up: At [facility], you and your health needs are our priority.  As part of our continuing mission to provide you with exceptional heart care, we have created designated Provider Care Teams.  These Care Teams include your primary Cardiologist (physician) and Advanced Practice Providers (APPs -  Physician Assistants and Nurse Practitioners) who all work together to provide you with the care  you need, when you need it. You may see Allen RedBridgette Dathan Attia, MD or one of the following Advanced Practice Providers on your designated Care Team:    Theodore DemarkRhonda Barrett, PA-C  Joni ReiningKathryn Lawrence, DNP, ANP  Cadence Fransico MichaelFurth, NP Your physician wants you to follow-up in: 6 months. You will receive a reminder letter in the mail two months in advance. If you don't receive a letter, please call our office to schedule the follow-up appointment.      Signed, Allen RedBridgette Adaia Matthies, MD PhD 04/26/2019  [facility]

## 2019-04-26 NOTE — Patient Instructions (Addendum)
Medication Instructions:  Your physician recommends that you continue on your current medications as directed. Please refer to the Current Medication list given to you today.  If you need a refill on your cardiac medications before your next appointment, please call your pharmacy.   Lab work: Lipids and Hepatic Function  Testing/Procedures: NONE  Follow-Up: At [facility], you and your health needs are our priority.  As part of our continuing mission to provide you with exceptional heart care, we have created designated Provider Care Teams.  These Care Teams include your primary Cardiologist (physician) and Advanced Practice Providers (APPs -  Physician Assistants and Nurse Practitioners) who all work together to provide you with the care you need, when you need it. You may see Buford Dresser, MD or one of the following Advanced Practice Providers on your designated Care Team:    Rosaria Ferries, PA-C  Jory Sims, DNP, ANP  Cadence Kathlen Mody, NP Your physician wants you to follow-up in: 6 months. You will receive a reminder letter in the mail two months in advance. If you don't receive a letter, please call our office to schedule the follow-up appointment.

## 2019-04-30 ENCOUNTER — Encounter: Payer: Self-pay | Admitting: Cardiology

## 2019-06-05 IMAGING — DX PORTABLE CHEST - 1 VIEW
1 series · 1 of 1 positions shown · non-contrast
Comparison: 08/24/2015

CLINICAL DATA: Chest pain began a couple days ago, with numbness in
tingling on left side

EXAM:
PORTABLE CHEST - 1 VIEW

[chest]
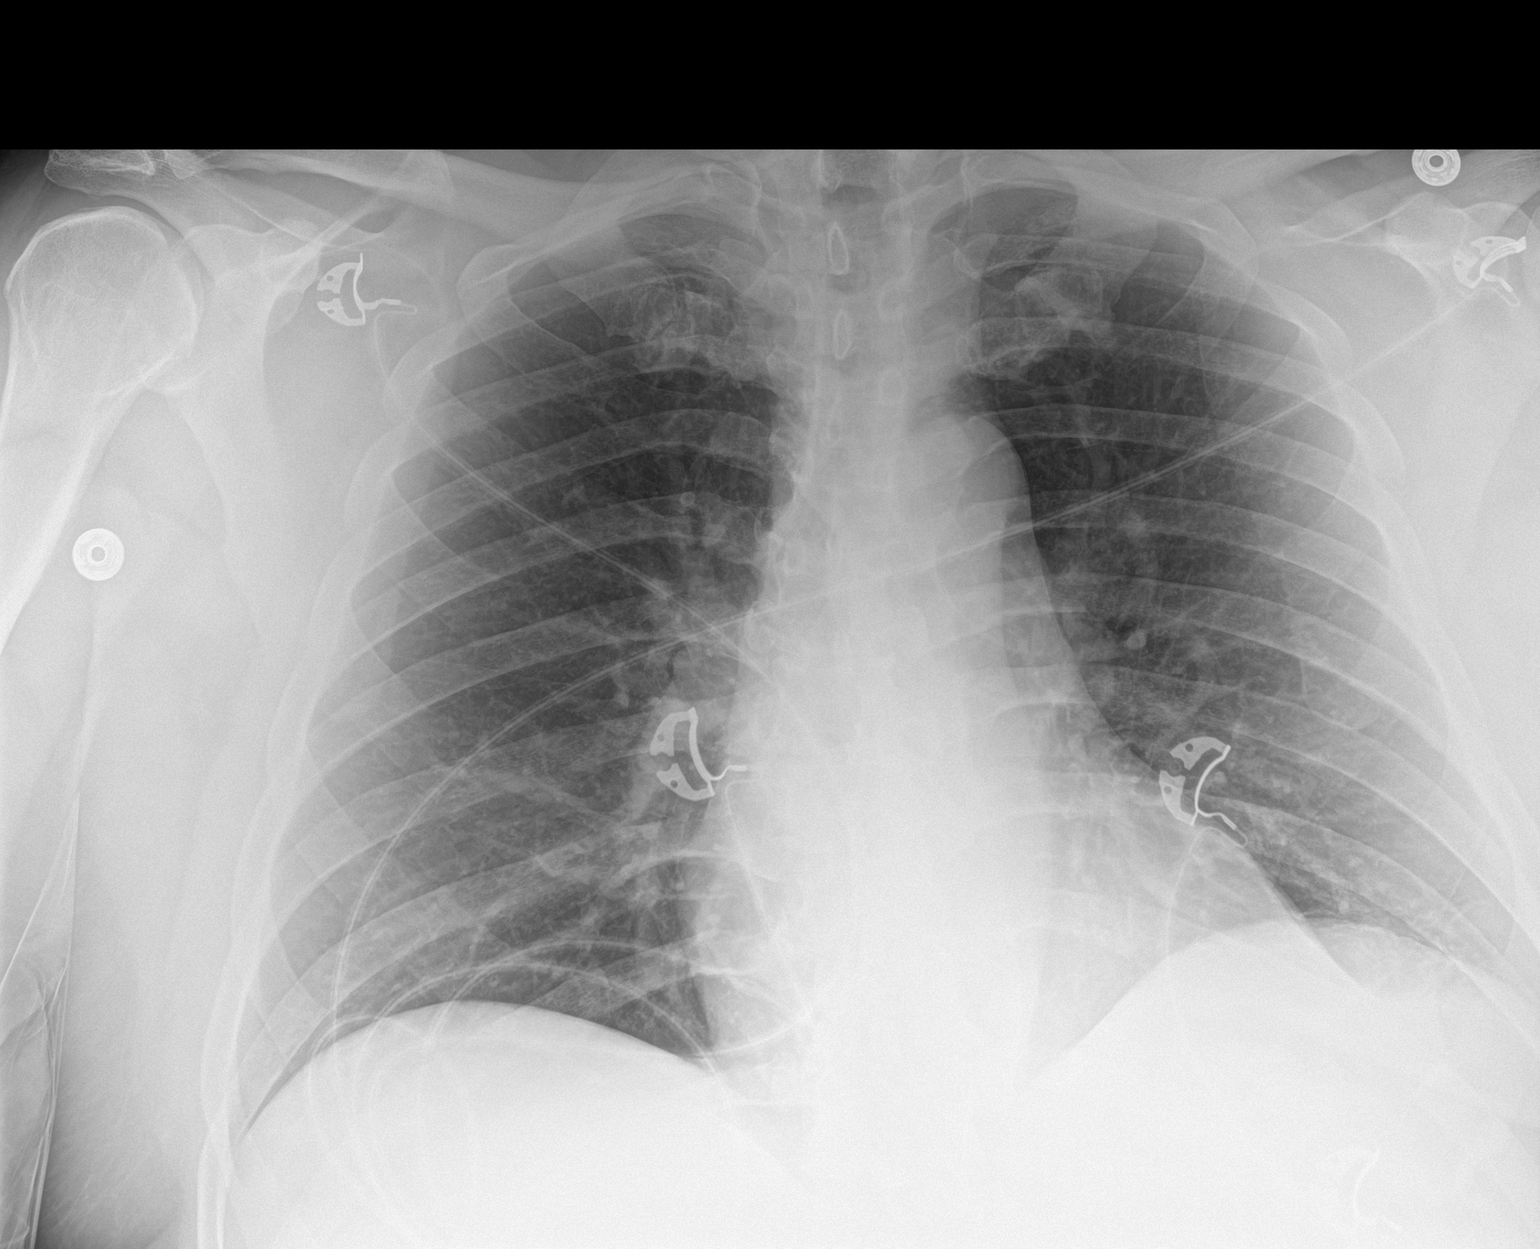

[1 of 1 positions shown; findings below may reference images not displayed]

FINDINGS: Lungs are clear.

Heart size and mediastinal contours are within normal limits.

No definite effusion, although the left lateral costophrenic angle
is excluded. No pneumothorax.

Visualized bones unremarkable.
IMPRESSION: No acute cardiopulmonary disease.

## 2019-08-04 ENCOUNTER — Ambulatory Visit: Payer: BLUE CROSS/BLUE SHIELD | Admitting: Internal Medicine

## 2020-01-28 ENCOUNTER — Other Ambulatory Visit: Payer: Self-pay | Admitting: Cardiology

## 2020-05-06 ENCOUNTER — Other Ambulatory Visit: Payer: Self-pay | Admitting: Cardiology

## 2020-06-08 ENCOUNTER — Other Ambulatory Visit: Payer: Self-pay | Admitting: Cardiology

## 2020-08-04 ENCOUNTER — Other Ambulatory Visit: Payer: Self-pay | Admitting: Cardiology

## 2020-09-10 ENCOUNTER — Other Ambulatory Visit: Payer: Self-pay | Admitting: Cardiology

## 2020-10-07 ENCOUNTER — Other Ambulatory Visit: Payer: Self-pay | Admitting: Cardiology

## 2020-11-03 ENCOUNTER — Other Ambulatory Visit: Payer: Self-pay | Admitting: Cardiology

## 2020-11-04 NOTE — Telephone Encounter (Signed)
Rx(s) sent to pharmacy electronically.  

## 2020-11-18 ENCOUNTER — Other Ambulatory Visit: Payer: Self-pay | Admitting: Cardiology

## 2020-12-21 ENCOUNTER — Other Ambulatory Visit: Payer: Self-pay | Admitting: Cardiology

## 2020-12-24 NOTE — Telephone Encounter (Signed)
Rx(s) sent to pharmacy electronically.  

## 2021-01-26 ENCOUNTER — Other Ambulatory Visit: Payer: Self-pay | Admitting: Cardiology

## 2021-02-17 ENCOUNTER — Other Ambulatory Visit: Payer: Self-pay

## 2021-02-17 MED ORDER — EZETIMIBE 10 MG PO TABS: ORAL_TABLET | ORAL | 0 refills | Status: AC
